# Patient Record
Sex: Male | Born: 1970 | Race: Black or African American | Hispanic: No | Marital: Married
Health system: Southern US, Community
[De-identification: ages and names within clinical notes are randomized; demographics above are authoritative.]

---

## 1999-10-27 ENCOUNTER — Emergency Department (HOSPITAL_COMMUNITY): Admission: EM | Admit: 1999-10-27 | Discharge: 1999-10-27 | Payer: Self-pay | Admitting: Emergency Medicine

## 2002-05-12 ENCOUNTER — Encounter: Payer: Self-pay | Admitting: Chiropractic Medicine

## 2002-05-12 ENCOUNTER — Encounter: Admission: RE | Admit: 2002-05-12 | Discharge: 2002-05-12 | Payer: Self-pay | Admitting: Chiropractic Medicine

## 2002-11-01 ENCOUNTER — Encounter: Payer: Self-pay | Admitting: Internal Medicine

## 2002-11-01 ENCOUNTER — Encounter: Payer: Self-pay | Admitting: Emergency Medicine

## 2002-11-01 ENCOUNTER — Observation Stay (HOSPITAL_COMMUNITY): Admission: EM | Admit: 2002-11-01 | Discharge: 2002-11-02 | Payer: Self-pay | Admitting: Emergency Medicine

## 2002-12-29 ENCOUNTER — Ambulatory Visit (HOSPITAL_COMMUNITY): Admission: RE | Admit: 2002-12-29 | Discharge: 2002-12-29 | Payer: Self-pay | Admitting: Gastroenterology

## 2002-12-29 ENCOUNTER — Encounter (INDEPENDENT_AMBULATORY_CARE_PROVIDER_SITE_OTHER): Payer: Self-pay | Admitting: Specialist

## 2004-05-26 ENCOUNTER — Inpatient Hospital Stay (HOSPITAL_COMMUNITY): Admission: EM | Admit: 2004-05-26 | Discharge: 2004-06-09 | Payer: Self-pay | Admitting: Emergency Medicine

## 2004-06-01 ENCOUNTER — Encounter (INDEPENDENT_AMBULATORY_CARE_PROVIDER_SITE_OTHER): Payer: Self-pay | Admitting: Specialist

## 2008-01-29 ENCOUNTER — Ambulatory Visit (HOSPITAL_BASED_OUTPATIENT_CLINIC_OR_DEPARTMENT_OTHER): Admission: RE | Admit: 2008-01-29 | Discharge: 2008-01-29 | Payer: Self-pay | Admitting: Urology

## 2008-01-29 ENCOUNTER — Encounter (INDEPENDENT_AMBULATORY_CARE_PROVIDER_SITE_OTHER): Payer: Self-pay | Admitting: Urology

## 2008-04-17 ENCOUNTER — Emergency Department (HOSPITAL_COMMUNITY): Admission: EM | Admit: 2008-04-17 | Discharge: 2008-04-17 | Payer: Self-pay | Admitting: Emergency Medicine

## 2009-12-11 ENCOUNTER — Emergency Department (HOSPITAL_COMMUNITY): Admission: EM | Admit: 2009-12-11 | Discharge: 2009-12-12 | Payer: Self-pay | Admitting: Emergency Medicine

## 2010-06-12 LAB — URINALYSIS, ROUTINE W REFLEX MICROSCOPIC
Glucose, UA: NEGATIVE mg/dL
Ketones, ur: NEGATIVE mg/dL
Nitrite: NEGATIVE
Specific Gravity, Urine: 1.022 (ref 1.005–1.030)
Urobilinogen, UA: 0.2 mg/dL (ref 0.0–1.0)
pH: 5.5 (ref 5.0–8.0)

## 2010-06-12 LAB — URINE MICROSCOPIC-ADD ON

## 2010-07-10 NOTE — Op Note (Signed)
NAME:  Pitkin, Sanay                ACCOUNT NO.:  [id number]   MEDICAL RECORD NO.:  [id number]          PATIENT TYPE:  AMB   LOCATION:  NESC                         FACILITY:  [facility]   PHYSICIAN:  Ronald L. Earlene Plater, M.D.  DATE OF BIRTH:  11/15/1970   DATE OF PROCEDURE:  01/29/2008  DATE OF DISCHARGE:                               OPERATIVE REPORT   ATTENDING:  Gaynelle Arabian, M.D.   ASSISTANT:  Dr.Bobin Patel.   PREOPERATIVE DIAGNOSIS:  Patient desiring sterilization.   POSTOPERATIVE DIAGNOSIS:  Patient desiring sterilization.   PROCEDURE:  Bilateral scalpel-less vasectomy.   INDICATIONS:  This is a 40 year old gentleman who desires surgical  sterilization.  Preoperatively, risks and benefits as well as  alternatives were discussed.  Specifically, we discussed with him the  need for continued use birth control methods until he is cleared with 2  negative semen analyses.   PROCEDURE IN DETAIL:  The patient was brought back to the operating room  and after the successful induction of LMA anesthetic he was prepped and  draped in the usual sterile fashion.  Please note a preoperative time-  out was performed and he received preprocedural antibiotics.   His left scrotum was palpated and his vas was brought to the skin. With  the left hand holding the vas to the skin, using the scalpel with  puncture device, the skin was punctured.  The vas was then grasped with  a vas grasper and cleaned of its tunic.  Once the vas was identified and  cleaned adequately it was tied proximally and distally, leaving  approximately a 2-inch portion in between that was excised.  All  bleeding was coagulated and a clamp was left on the area of vas  resection so it could be reinspected at the end of the case.   We turned our attention to the right side.  In much the same fashion,  the vas was identified by palpation through the skin and brought to the  skin.  Using an incisionless puncture device, we  punctured the skin,  placed a vas clamp around the vas and cleaned it of its tunic.  We tied  proximally and distally, leaving approximately 2 cm of vas in between  that was excised.  The ends of the vas were coagulated and all bleeding  was stopped with cautery.   At this point both sides were reinspected.  Both vasa had been cut and  sent for pathologic review.  All ends of the vas, proximal and distal  bilaterally, were coagulated.  As there was no bleeding, the procedure  was ended by placing the remaining vas back into the scrotum with  tenting of the scrotal skin.  At this point the procedure was ended,  4x4s placed over the wound.   Please note Dr. Earlene Plater was the responsible surgeon and present throughout  the entirety of the case.   ESTIMATED BLOOD LOSS:  Minimal.   URINE OUTPUT:  Unrecorded.   DRAINS:  None.   SPECIMENS:  Bilateral vasa, sent separately.   COMPLICATIONS:  None apparent.   DISPOSITION:  The patient to the PACU.      Delman Kitten, MD      Lucrezia Starch. Earlene Plater, M.D.  Electronically Signed    DW/MEDQ  D:  01/29/2008  T:  01/29/2008  Job:  [id number]

## 2010-07-13 NOTE — Discharge Summary (Signed)
NAME:  Whitaker, Brent GAIL NO.:  [id number]   MEDICAL RECORD NO.:  [id number]          PATIENT TYPE:  INP   LOCATION:  0451                         FACILITY:  [facility]   PHYSICIAN:  Theone Stanley, MD   DATE OF BIRTH:  02/02/1971   DATE OF ADMISSION:  05/26/2004  DATE OF DISCHARGE:                                 DISCHARGE SUMMARY   ADMITTING DIAGNOSES:  1.  Vomiting and abdominal pain.  2.  Allergic rhinitis.   DISCHARGE DIAGNOSES:  1.  Gallstone pancreatitis.  2.  Cholelithiasis.  3.  Status post laparoscopic cholecystectomy with intraoperative      cholangiogram.   CONSULTATIONS:  [facility], Adolph Pollack, M.D.   PROCEDURES/DIAGNOSTIC TESTS:  The patient had an abdominal ultrasound on  May 26, 2004.  Impression was cholelithiasis and equivocal findings of  cholecystitis, no evidence of biliary dilatation, pancreas and aorta not  visualized well.  A CT of the abdomen and pelvis was performed on May 28, 2004 which showed;  Impression:  Diffuse pancreatitis, question a 1.5-cm low  density lesion within the inferior aspect of the right lobe of the liver  versus streak artifact, a small amount of fluid in he cul-d-sac without  drainable abscess, mild, degenerative changes lower lumbar spine.  Intraoperative cholangiogram was performed during the operation on June 01, 2004;  Impression:  No cholangiographic evidence of cholelithiasis.  A chest  x-ray was performed on June 04, 2004 to visualize his PICC line.  Impression:  Right PICC line tip caval-atrial junction, no change basilar  atelectasis.  As noted before, he patient had a laparoscopic cholecystectomy  and intraoperative cholangiogram on June 01, 2004.   PERTINENT LABORATORY VALUES:  Last CBC was on June 07, 2004 which showed a  white count of 8.3, hemoglobin 12, hematocrit 35, platelets at 390,000.  June 09, 2004 was his last BMET which showed a sodium of 139, potassium  4.1,  chloride 110, CO2 25, glucose 193, BUN 10, creatinine 0.8, calcium 9.1,  amylase 294, lipase 194.  Prealbumin on June 08, 2003 was 16.6.   HOSPITAL COURSE:  Mr. Brent Whitaker is a very pleasant 40 year old African-American  gentleman with a history of cholelithiasis presenting to the emergency room  with a several-day history of intractable vomiting and epigastric pain with  radiation into the back.  He had no hematemesis, no fever, or no chills on  his presentation.  He was slightly short of breath.  He was unable to keep  any liquid down.  Because of this, he presented to the ER.  Ultrasound in  the ER showed biliary colic and cholelithiasis.  Noted on his admission, he  had elevated amylase and lipase indicating pancreatitis.   Gallstone pancreatitis.  The patient was admitted with conservative  management, n.p.o., given pain medications, anti-emetics.  His original  white count on admission was 11.6, hemoglobin of 13, hematocrit of 39,  platelets at 221,000.  On complete metabolic profile, abnormalities was a  glucose of 158, BUN of 8, creatinine of 1.  AST at 3000, ALT at 409,  lipase  at 997, amylase at 1435.  A hemoglobin A1c was also performed on May 27, 2004 which showed that it was 5.7 indicating his acute elevation of his  blood sugars were secondary to his current problem.  Because of his  presentation, surgery was consulted, and it was felt that once the patient's  situation had defervesced that he will need to be taken to surgery.  Over  the next ensuing days, the patient continued to have pains.  His laboratory  values defervesced.  He was afebrile.  However, because of his n.p.o. status  for an extensive period of time, it was decided to place a PICC and start  him on TPN.  The patient continued to do well, and it was felt that he could  be taken to surgery on June 01, 2004.  The patient underwent a laparoscopic  cholecystectomy with intraoperative cholangiogram.  This was  performed  without any complications.  Postoperatively, the patient did well;  however,  the issue was that he continued to have elevated amylase and lipase,  although clinically he was very stable.  He continued to be n.p.o., but as  his amylase and lipase stated to trend down and he was clinically stable, it  was felt that we could try him on a clear liquid diet.  The patient was  started on a clear liquid diet on June 08, 2004.  He tolerated this well.  He had no abdominal pain.  Because of this, it was decided that he could be  discharged on a clear liquid diet and increase his p.o. intake and advance  his diet slowly.  The patient understood that if he started to have  increasing abdominal pain to contact his primary care physician or come to  the ER and to continue with his clear liquid diet for the next couple of  days and advance slowly.   DISCHARGE MEDICATIONS:  1.  Claritin at home dose.  2.  Protonix 40 mg 1 p.o. daily for an additional two weeks.  3.  Lortab 5/500 1-2 tablets p.o. q.6-8 h. p.r.n.   DISCHARGE INSTRUCTIONS:  The patient was instructed to increase his activity  slowly.  He is to return to work on June 18, 2004.  To advance his diet  slowly, continue with a clear liquid diet for the next couple of days and  the slowly add different foods.   FOLLOW UP:  The patient is to follow up with Dr. Abbey Chatters in 1-2 weeks.  He is supposed to get laboratory tests, a CBC, amylase, and lipase at that  time, and Dr. Clovis Riley in 2-3 weeks.      AEJ/MEDQ  D:  06/09/2004  T:  06/09/2004  Job:  [id number]   cc:   Adolph Pollack, M.D.  [address]  [place]  [place] [address]

## 2010-07-13 NOTE — Op Note (Signed)
NAME:  Brent Whitaker, Brent Whitaker                          ACCOUNT NO.:  [id number]   MEDICAL RECORD NO.:  [id number]                   PATIENT TYPE:  AMB   LOCATION:  ENDO                                 FACILITY:  [facility]   PHYSICIAN:  Danise Edge, M.D.                DATE OF BIRTH:  07-07-1970   DATE OF PROCEDURE:  12/29/2002  DATE OF DISCHARGE:                                 OPERATIVE REPORT   PROCEDURES:  Esophagogastroduodenoscopy, small bowel biopsy, and  colonoscopy.   PROCEDURE INDICATION:  Mr. Jun Osment. Totten is a 40 year old male born 07/21/1970.  Mr. Edmonston was hospitalized following a bout of biliary colic  associated with gallstones by ultrasound.  On admission his hemoglobin was  slightly low at 10.7 g.  His serum ferritin was measured at approximately 5  ng/mL and his serum iron saturation was low at 13%.   Mr. Apollo is scheduled to undergo esophagogastroduodenoscopy with small bowel  biopsies to look for pathologic signs of the presence of celiac disease and  also undergo a screening colonoscopy.   ENDOSCOPIST:  Danise Edge, M.D.   PREMEDICATION:  Versed 7.5 mg, Demerol 50 mg.   PROCEDURE:  Esophagogastroduodenoscopy with small bowel biopsies.  After  obtaining informed consent, Mr. Gillooly was placed in the left lateral  decubitus position.  I administered intravenous Demerol and intravenous  Versed to achieve conscious sedation for the procedure.  The patient's blood  pressure, oxygen saturation, and cardiac rhythm were monitored throughout  the procedure and documented in the medical record.   The Olympus gastroscope was passed through the posterior hypopharynx into  the proximal esophagus without difficulty.  The hypopharynx, larynx, and  vocal cords appeared normal.   Esophagoscopy:  The proximal, mid-, and lower segments of the esophageal  mucosa appear normal.   Gastroscopy:  Mr. Brannen has a small hiatal hernia.  Retroflexed view of the  gastric cardia and  fundus was normal.  The gastric body, antrum, and pylorus  appear normal.   Duodenoscopy:  The duodenal bulb, second portion of duodenum, and third  portion of duodenum appear normal endoscopically.  Four biopsies were taken  from the second portion of the duodenum and submitted for pathologic  evaluation.   ASSESSMENT:  Normal esophagogastroduodenoscopy.  Small bowel biopsies to  rule out celiac sprue pending.   PROCEDURE:  Screening proctocolonoscopy to the cecum.  Anal inspection was  normal.  Digital rectal exam was normal.  The prostate was non-nodular.  The  Olympus pediatric colonoscope was introduced into the rectum and easily  advanced to the cecum.  Colonic preparation for the exam today was  excellent.   Rectum normal.   Sigmoid colon and descending colon normal.   Splenic flexure normal.   Transverse colon normal.   Hepatic flexure normal.   Ascending colon normal.   Cecum and ileocecal valve normal.   ASSESSMENT:  Normal proctcolonoscopy to the cecum.   RECOMMENDATIONS:  1. Mr. Gilliam has known gallstones and has recovered from his first biliary     colic attack.  If he has any further biliary colic-type pain, he should     undergo a laparoscopic cholecystectomy.  2. Mr. Sparacino has unexplained iron-deficiency anemia.  Colonoscopy is normal.     Esophagogastroduodenoscopy is normal.  Small bowel biopsies are pending     to rule out celiac sprue.                                               Danise Edge, M.D.    MJ/MEDQ  D:  12/29/2002  T:  12/29/2002  Job:  [id number]   cc:   L. Lupe Carney, M.D.  [address] [place]  [place] [address]  Fax: [phone]

## 2010-07-13 NOTE — H&P (Signed)
NAME:  Brent Whitaker, Brent Whitaker                ACCOUNT NO.:  [id number]   MEDICAL RECORD NO.:  [id number]          PATIENT TYPE:  INP   LOCATION:  0451                         FACILITY:  [facility]   PHYSICIAN:  Corinna L. Lendell Caprice, MDDATE OF BIRTH:  03/16/1970   DATE OF ADMISSION:  05/26/2004  DATE OF DISCHARGE:                                HISTORY & PHYSICAL   CHIEF COMPLAINT:  Vomiting and stomachache.   HISTORY OF PRESENT ILLNESS:  Brent Whitaker is a pleasant 40 year old black male  with a history of cholelithiasis, who presents to the emergency room with a  several day history of intractable vomiting and epigastric pain with  radiation into the back.  He has had no hematemesis, no fevers or chills.  He has had some slight shortness of breath.  He drinks occasionally, but  denies drinking to excess, nor daily.  He is unable to keep down any  liquids.  He feels a bit better after receiving pain medication and anti-  emetics in the emergency room.  He has had ultrasounds in the past for  biliary colic, and was noted to have cholelithiasis.   PAST MEDICAL HISTORY:  As above.  Otherwise, seasonal allergic rhinitis.   MEDICATIONS:  Claritin.   SOCIAL HISTORY:  The patient drinks occasionally, but denies excessive  drinking.  He does not smoke or drink.  He is here with his wife.   FAMILY HISTORY:  Noncontributory.   REVIEW OF SYSTEMS:  As above.  Otherwise, negative.   PHYSICAL EXAMINATION:  VITAL SIGNS:  Temperature is 98.7, blood pressure  137/86, pulse 78, respiratory rate 20, oxygen saturation 95% on room air.  GENERAL:  The patient is well-nourished, well-developed, who appears to be  in some pain.  HEENT:  Normocephalic and atraumatic.  Pupils equal, round and reactive to  light.  Sclerae are anicteric.  He has no conjunctivitis.  He has moist  mucous membranes.  Oropharynx is without erythema or exudate.  NECK:  Supple.  No lymphadenopathy.  No thyromegaly.  No lymphadenopathy.  LUNGS:   Clear to auscultation bilaterally without wheezes, rhonchi, or  rales.  CARDIOVASCULAR:  Regular rate and rhythm without murmurs, gallops, or rubs.  ABDOMEN:  Normal bowel sounds, soft.  Minimally tender over the epigastrium  after receiving several doses of morphine and Dilaudid.  GU/RECTAL:  Deferred.  EXTREMITIES:  No clubbing, cyanosis, or edema.  NEUROLOGIC:  Alert and oriented.  Cranial nerves and sensory and motor exam  are grossly intact.  PSYCHIATRIC:  Normal affect.  SKIN:  No rash.   LABORATORY DATA:  White blood cell count is 11.6 with 92% neutrophils.  The  rest of his CBC is unremarkable.  Complete metabolic panel significant for a  glucose of 158, SGOT of 300, SGPT of 409.  His alkaline phosphatase is  normal at 117.  Amylase is 1435.  Lipase is 997.  UA shows 30 protein;  otherwise, unremarkable.  Ultrasound of the right upper quadrant shows  cholelithiasis with many stones at the neck of the gallbladder.  No ductal  dilatation or common duct stone visualized.  ASSESSMENT AND PLAN:  1.  Gallstone pancreatitis.  The patient will be admitted to the floor,      receive IV fluids.  He will be NPO.  Get pain medications, anti-emetics,      proton pump inhibitor.  He will eventually need a surgical consult for a      cholecystectomy after his pancreatitis improves.  I will follow serial      abdominal exams and pancreatic enzymes.  2.  Increased liver function tests secondary to above.  3.  Hyperglycemia - no history of diabetes.  This is most likely stress      response, and I will check a hemoglobin A1C.      CLS/MEDQ  D:  05/26/2004  T:  05/26/2004  Job:  [id number]   cc:   L. Lupe Carney, M.D.  [address] [place]  [place] [address]  Fax: [phone]   Danise Edge, M.D.  [address]  [place]  [place] [address]  Fax: [phone]

## 2010-07-13 NOTE — Discharge Summary (Signed)
NAME:  Brent Whitaker, Brent Whitaker                          ACCOUNT NO.:  [id number]   MEDICAL RECORD NO.:  [id number]                   PATIENT TYPE:  INP   LOCATION:  0450                                 FACILITY:  [facility]   PHYSICIAN:  Jackie Plum, M.D.             DATE OF BIRTH:  October 09, 1970   DATE OF ADMISSION:  11/01/2002  DATE OF DISCHARGE:  11/02/2002                                 DISCHARGE SUMMARY   PRIMARY CARE PHYSICIAN:  L. Lupe Carney, M.D.   GASTROENTEROLOGIST:  Danise Edge, M.D.   DISCHARGE DIAGNOSES:  1. Cholelithiasis.  2. Abdominal pain acute intermittent, resolved.  3. Iron deficiency anemia etiology unclear on iron therapy now.   DISCHARGE MEDICATIONS:  1. Protonix 40 mg q.d.  2. Niferex 150 mg q.d.  3. Colace 100 to 200 mg q.d. p.r.n.   ALLERGIES:  NKDA.   PROCEDURE:  None.   HISTORY OF PRESENT ILLNESS:  The patient is a 40 year old, black male who  presents with a sudden onset of epigastric abdominal pain and recurrent  vomiting times three.  There was no associated hematemesis, melena, or  hematochezia.  No fevers, shortness of breath, or dysuria.  He was treated  with Demerol and Phenergan in the emergency department, which helped his  pain, but made him very sedated.  He has had several prior episodes of this.  About a year and a half ago, he was seen at [facility] Room with  an episode of epigastric pain, but left after the pain had resolved.  He  denies taking any current medications.  He states he did not consume any  alcohol or illicit drugs on day of presentation.  He is admitted for further  evaluation.   HOSPITAL COURSE:  Patient is admitted to a regular bed, provided with IV  hydration, initially kept n.p.o. and then his diet was gradually increased  from clear liquids to a full regular diet, which he tolerated without  difficulty.  Patient's pain and nausea, as well as his vomiting subsided  after his initial dose of Demerol and  Phenergan.  On day of discharge, he is  pain [place].  Patient was started on Protonix IV initially and changed to p.o.  prior to discharge; he tolerated this well.  Patient was evaluated by Dr.  Danise Edge of [facility] for findings consistent with cholelithiasis on  abdominal CT.  There were no other significant findings on abdominal CT.  Dr. Laural Benes felt that the patient was appropriate for EGD and colonoscopy as  an outpatient.  We recommended discharge on Protonix and Niferex as noted.  KUB of the abdomen revealed moderate constipation, which was relieved with  Mag-Citrate.  Patient's bowel sounds are present in all 4 quadrants.  There  is abdominal tenderness, masses, or organomegaly noted at this time.   The patient was found to have normocytic normochromic anemia with a  hemoglobin of  10.4, hematocrit of 31.3 and an MCV of 81.5.  Iron studies  revealed iron of 45 mg/dL.  A TIBC of 355% saturation of 13 and blood  ferritin of 5.  Patient is therefore started on Niferex therapy to correct  his anemia.  Source of this anemia is unknown at this time; however, he is  asymptomatic.   Patient, at time of discharge, if Mosher of abdominal pain, nausea, or  vomiting.  Vital signs are stable.  He is afebrile.  He is able to tolerate  regular food.   DISCHARGE LABS:  Other than those as noted in the narrative:  Total  cholesterol 161, triglycerides 99, HDL 30, LDL 111, sodium 138, potassium  3.3, glucose 90, BUN 5, creatinine 1.1.  UA is negative for ketones,  infection, or glucose.  Lipase 35, amylase 147.   CONSULTS:  Danise Edge, M.D. of [facility].   CONDITION AT DISCHARGE:  Good.   DISPOSITION:  Discharged to home.   FOLLOW UP:  Patient is instructed to followup with Dr. Danise Edge for  further evaluation of his abdominal pain and cholelithiasis.  He is provided  with his phone number by Dr. Laural Benes.  He is instructed to followup with his  primary MD, Dr. Clovis Riley  p.r.n.       Ellender Hose. Christian Mate, M.D.    SMD/MEDQ  D:  11/02/2002  T:  11/02/2002  Job:  [id number]   cc:   L. Lupe Carney, M.D.  [address] [place]  [place] [address]  Fax: [phone]   Danise Edge, M.D.  [address]  [place]  [place] [address]  Fax: [phone]

## 2010-07-13 NOTE — Op Note (Signed)
NAME:  Brent Whitaker, Brent Whitaker                ACCOUNT NO.:  [id number]   MEDICAL RECORD NO.:  [id number]          PATIENT TYPE:  INP   LOCATION:  0451                         FACILITY:  [facility]   PHYSICIAN:  Adolph Pollack, M.D.DATE OF BIRTH:  02/08/1971   DATE OF PROCEDURE:  06/01/2004  DATE OF DISCHARGE:                                 OPERATIVE REPORT   PREOPERATIVE DIAGNOSIS:  Biliary pancreatitis.   POSTOPERATIVE DIAGNOSIS:  Biliary pancreatitis.   PROCEDURE:  Laparoscopic cholecystectomy with intraoperative cholangiogram.   SURGEON:  Adolph Pollack, M.D.   ASSISTANT:  Gita Kudo, M.D.   ANESTHESIA:  General.   INDICATIONS:  Mr. Dolinger is a 40 year old male admitted to the medical service  on May 26, 2004.  He was diagnosed with biliary pancreatitis.  He has  slowly improved and clinically is asymptomatic.  His pancreatic enzymes have  come down.  Liver function tests are minimally elevated (SGPT).  He now  presents for laparoscopic cholecystectomy.  The procedure and risks,  including but not limited to bleeding, infection, common bile duct injury,  hepatic injury, bile leak, intestinal injury, exacerbation of pancreatitis,  and cardiopulmonary complications of anesthesia were explained in preop.   TECHNIQUE:  He was seen in the holding area, brought to the operating room,  placed supine on the operating room table.  General anesthetic was  administered.  His Foley catheter was placed in the bladder.  The abdominal  wall was sterilely prepped and draped.  Dilute Marcaine solution was  infiltrated in the subumbilical  region, and a small subumbilical incision  was made through the skin and subcutaneous tissue, fascia, and peritoneum  under direct vision.  The peritoneal cavity was entered, and a purse-string  suture of 0 Vicryl was placed around the fascial edges.  A Hasson trocar is  introduced into the peritoneal cavity, and a pneumoperitoneum was created by  insufflation of CO2 gas.   Next, a laparoscope was introduced.  He was placed in the reverse  Trendelenburg position.  A 10 mm trocar is placed through an epigastric  incision, and two 5 mm trocars are placed to the right mid abdomen.  I used  a 30 to rescope.   The fundus of the gallbladder was grasped, and it was intrahepatic.  There  is no acute gallbladder inflammatory change.  The infundibulum was grasped,  and it was mobilized using blunt dissection.  I identified the cystic duct  and created a window around it.  A clip is placed at the cystic  duct/gallbladder junction.  A small incision was made just below the cystic  duct/gallbladder junction.  A cholangiocatheter was placed through the  abdominal wall into the cystic duct, and a cholangiogram was performed.  Under real-time fluoroscopy, dilute contrast material was injected into a  long cystic duct.  The common hepatic, right and left hepatic, and common  bile ducts were visualized.  Contrast splashed into the duodenum rapidly  without obvious evidence of obstruction.  The final report is pending the  radiologist's interpretation.   The cholangiocatheter was removed.  The cystic  duct was then clipped three  times proximally and divided.  The cystic artery was identified, clipped,  and divided.  The gallbladder was dissected Haldeman from the liver bed intact.  What appeared to be a potential excess reductive Luschka was clipped.  Once  the gallbladder was dissected Septer from the liver bed, it was placed in the  Endopouch bag.  The gallbladder fossa was irrigated, and bleeding points  controlled with the cautery.  The area was inspected, and no further  bleeding was noted.  No bile leak was noted.  The gallbladder was then  removed through the subumbilical port in the Endopouch bag.  The  subumbilical fascial defect was closed under laparoscopic vision by  tightening up and tying down the purse-string suture.  The perihepatic  area  was irrigated, and fluid was returned clear.  The remaining trocars were  removed, and a pneumoperitoneum was released.  The skin incisions were  closed with 4-0 Monocryl subcuticular stitches followed by Steri-Strips and  sterile dressings.  He tolerated the procedure well without any apparent  complications and was taken to the recovery room in satisfactory condition.      TJR/MEDQ  D:  06/01/2004  T:  06/01/2004  Job:  [id number]

## 2010-07-13 NOTE — H&P (Signed)
NAME:  Brent Whitaker, Brent Whitaker NO.:  [id number]   MEDICAL RECORD NO.:  [id number]                   PATIENT TYPE:  EMS   LOCATION:  ED                                   FACILITY:  [facility]   PHYSICIAN:  Sherin Quarry, MD                   DATE OF BIRTH:  Oct 01, 1970   DATE OF ADMISSION:  11/01/2002  DATE OF DISCHARGE:                                HISTORY & PHYSICAL   HISTORY OF PRESENT ILLNESS:  This 40 year old man is currently very sedated  after receiving an injection of Demerol and Phenergan, which makes it very  difficult to obtain any history.  Most of the history is obtained from a  friend who accompanied him.  Apparently, on Sunday the patient ate at  multiple fast-food restaurants including [employer], [employer], and possibly  some other fast-food restaurant, and then went to a [place] steakhouse  where he ate chicken and steak.  He came home about 10 p.m. and went to  sleep but was awakened at 1:30 by severe epigastric pain and recurrent  vomiting x3.  There was no associated hematemesis, melena, or hematochezia.  No fever, shortness of breath, or dysuria.  He presented to the [facility]  Emergency Room where he was given Demerol 25 and Phenergan 12.5 which has,  as mentioned above, sedated him rather heavily.  According to the patient  and his friend, he has had several similar episodes.  About 1-1/2 years ago  go he went to the [facility] Emergency Room with an episode of epigastric  pain but left after about an hour when the pain resolved.  He states that he  is not currently taking any medications.  He states he did not consume any  alcohol, has not used any drugs today.   PAST MEDICAL HISTORY:   ALLERGIES:  None.   MEDICATIONS:  None.   ILLNESSES:  None.   OPERATIONS:  The patient states he has had multiple dental extractions.   FAMILY HISTORY:  The patient's mother died when he was 3.  He is not sure  exactly why.  His father had  trouble with blood clotting, and he is not  sure whether he has any other health problems.  He has four siblings who are  alive and well.   SOCIAL HISTORY:  The patient works at [employer] and also at [employer].  He does not smoke.  He will occasionally have one or two alcoholic beverages  in a day.  He denies drug use.   REVIEW OF SYSTEMS:  HEAD:  He denies headache or dizziness.  EYES:  He  denies visual blurring or diplopia.  EARS, NOSE, THROAT:  Denies earache,  sinus pain, or sore throat.  CHEST:  Denies coughing, wheezing, or chest  congestion.  CARDIOVASCULAR:  Denies orthopnea, PND, or ankle edema.  GASTROINTESTINAL:  See above.  GENITOURINARY:  Denies dysuria, urinary  frequency, hesitancy, or nocturia.  RHEUMATOLOGIC:  Denies back pain or  joint pain.  HEMATOLOGIC:  Denies easy bleeding or bruising.  NEUROLOGIC:  Denies history of seizure or stroke.   PHYSICAL EXAMINATION:  VITAL SIGNS:  Blood pressure is 149/85, pulse is 72,  respirations 16.  HEENT:  Within normal limits.  CHEST:  Clear to auscultation and percussion.  BACK:  No CVA or point tenderness.  CARDIOVASCULAR:  Normal S1, S2.  There are no rubs, murmurs, or gallops.  ABDOMEN:  There are positive bowel sounds which appear to be normal.  I  cannot detect any guarding or rebound tenderness.  Abdominal tenderness is  difficult to assess because the patient is sedated.  With vigorous palpation  in the epigastric area I was not able to elicit any tenderness.  NEUROLOGIC:  Normal.  EXTREMITIES:  Normal.   LABORATORY STUDIES:  Urinalysis was normal.  White count was 11,700.  Liver  profile was normal.  Renal function was normal.  Amylase was 147, lipase was  35.   IMPRESSION:  1. Recurrent epigastric pain associated with vomiting on this occasion.     Rule out gastritis, rule out peptic ulcer disease, rule out     cholecystitis, rule out mild pancreatitis.  2. Status post multiple dental extractions.   PLAN:   Will keep the patient n.p.o. for now and allow clear liquids when he  is alert.  Given intravenous fluids.  Will give him empiric IV Protonix,  p.r.n. pain medications.  The patient will be scheduled for abdominal  ultrasound to evaluate the gallbladder and pancreas.  Contingency if the  patient's symptoms  recur will be GI consult for possible endoscopy.                                               Sherin Quarry, MD    SY/MEDQ  D:  11/01/2002  T:  11/01/2002  Job:  [id number]   cc:   L. Lupe Carney, M.D.  [address] [place]  [place] [address]  Fax: [phone]

## 2010-07-13 NOTE — Consult Note (Signed)
   NAME:  Nicklas, Brent Whitaker NO.:  [id number]   MEDICAL RECORD NO.:  [id number]                   PATIENT TYPE:  INP   LOCATION:  0450                                 FACILITY:  [facility]   PHYSICIAN:  Danise Edge, M.D.                DATE OF BIRTH:  04/30/1970   DATE OF CONSULTATION:  11/02/2002  DATE OF DISCHARGE:  11/02/2002                                   CONSULTATION   HISTORY:  Mr. Conlee Sliter is a 40 year old male, born [birth year].  Mr.  Panico was admitted through the [facility] Emergency Room, November 01, 2002, to evaluated acute epigastric pain, nausea, and vomiting without  gastrointestinal bleeding.   His hospital evaluation revealed the following:  Acute abdominal x-ray  series revealed bibasilar atelectasis; abdominal ultrasound revealed  gallstones; admission white blood cell count 11,700 and follow-up white  blood cell count 6400; hemoglobin 11.5 g on admission dropping to 10.4 g  over 24 hours; MCV and platelet count normal.  Serum ferritin low at 5 ng/mL  and iron saturation low at 13%.  Complete metabolic profile was normal  except for an HDL cholesterol 30.  Amylase and lipase were normal.  Urinalysis was normal.   Mr. Mozer feels fine today.  He has consumed a regular diet.  He reports no  gastrointestinal bleeding.  His abdominal pain, vomiting have resolved.   HEENT:  Sclerae and conjunctivae normal.  LUNGS:  Clear to auscultation.  CARDIAC:  Regular rhythm without murmurs.  ABDOMEN:  Soft, flat, and nontender.  SKIN:  Warm and dry.   ASSESSMENT:  1. Gallstones by ultrasound.  I suspect Mr. Gauss intense upper abdominal     pain with vomiting was secondary to an attack of biliary colic which has     resolved.  2. Iron deficiency anemia; differential diagnosis would include chronic     blood loss, decreased iron absorption, hemolysis, hyperthyroidism.   RECOMMENDATIONS:  Mr. Popowski can be discharged from the  hospital today.  He  will see me in the office to schedule a diagnostic  esophagogastroduodenoscopy with possible small bowel biopsy and colonoscopy.                                               Danise Edge, M.D.    MJ/MEDQ  D:  11/02/2002  T:  11/02/2002  Job:  [id number]   cc:   Sherin Quarry, MD

## 2010-07-13 NOTE — Consult Note (Signed)
NAME:  Brent Whitaker, Brent Whitaker                ACCOUNT NO.:  [id number]   MEDICAL RECORD NO.:  [id number]          PATIENT TYPE:  INP   LOCATION:  0451                         FACILITY:  [facility]   PHYSICIAN:  Angelia Mould. Derrell Lolling, M.D.DATE OF BIRTH:  1970/08/03   DATE OF CONSULTATION:  05/28/2004  DATE OF DISCHARGE:                                   CONSULTATION   REASON FOR CONSULTATION:  Evaluate gallstone pancreatitis.   HISTORY OF PRESENT ILLNESS:  This is a 40 year old black man who states that  for the last 2 or 3 years, he has had some intermittent episodes of upper  abdominal pain, nausea and vomiting with pain radiating to his back. He  states he went to an emergency room once, had an ultrasound and was told he  had gallstones but that nothing needed to be done. He has basically had  minor episodes of pain over the past two years which he would take care of  at home.   He came to the [facility] Emergency Room on May 26, 2004 complaining of a  several day history of intractable vomiting, epigastric pain radiating to  the back. He denied vomiting any blood, denied fever or chills. He could not  keep down water or liquids. He was admitted by Corinna L. Lendell Caprice, MD for  management of pancreatitis.   PAST MEDICAL HISTORY:  He has been healthy, he has allergic rhinitis. He has  had some dental work otherwise negative.   CURRENT MEDICATIONS:  Claritin.   ALLERGIES:  No drug allergies but he is allergic to LATEX.   SOCIAL HISTORY:  He drinks occasionally but denies excessive drinking. He  does not smoke. He is married. He has two boys. He works for a [employer].   FAMILY HISTORY:  There is no familial disease and this is noncontributory.   REVIEW OF SYMPTOMS:  All systems are reviewed, they are noncontributory  except as described above.   PHYSICAL EXAMINATION:  GENERAL:  A pleasant young black man who is  overweight in mild distress from continued abdominal pain. He is  appropriate  and alert.  VITAL SIGNS:  Temperature 98.4, respiratory rate 20, heart rate 73, blood  pressure 139/79.  HEENT:  Eyes, sclera clear, extraocular movements intact. Ears, nose, mouth  and throat, nose, lips, tongue and oropharynx are without gross lesions.  NECK:  Supple, nontender, no mass, no adenopathy, no jugular venous  distention.  LUNGS:  Clear to auscultation, no wheezes, no rhonchi.  HEART:  Regular rate and rhythm, no murmurs or ectopy.  ABDOMEN:  Slightly obese, soft. He is tender in the right upper quadrant and  epigastrium and to a lesser degree the left upper quadrant but there is  really no peritoneal signs. No mass. Liver and spleen is not enlarged.  EXTREMITIES:  Moves all four extremities well without pain or deformity.  NEUROLOGIC:  No gross motor sensory deficits.   LABORATORY DATA:  During his 48 hour hospital course, his amylase has gone  from 1435 to 484 and then down to 208 today. His total bilirubin  has always  been normal. Alkaline phosphatase is normal. SGOT and SGPT are elevated and  have remained so.   A gallbladder ultrasound shows gallstones.  There is no wall thickening or  biliary ductal dilatation.   ASSESSMENT:  Biliary pancreatitis. This has been going on for several days  and does seem to be resolving although slowly.   PLAN:  We will hold off on cholecystectomy at this point until I get some  more objection information on how severe his pancreatitis is. We will obtain  a CT scan today to see how much pancreatic inflammation there is. I will  decide regarding timing of cholecystectomy thereafter.   In general, he should go ahead with the cholecystectomy this admission.      HMI/MEDQ  D:  05/28/2004  T:  05/28/2004  Job:  [id number]   cc:   Corinna L. Lendell Caprice, MD   L. Lupe Carney, M.D.  [address] [place]  [place] [address]  Fax: [phone]   Danise Edge, M.D.  [address]  [place]  [place] [address]   Fax: [phone]

## 2011-02-20 IMAGING — CT CT ABDOMEN W/O CM
2 of 4 series · 17 of 46 positions shown, 19 images · non-contrast
Comparison: 05/28/2004

CT ABDOMEN

CLINICAL DATA: Left flank abdominal pelvic pain.

CT ABDOMEN AND PELVIS WITHOUT CONTRAST
TECHNIQUE: Multidetector CT imaging of the abdomen and pelvis was
performed following the standard protocol without intravenous
contrast.

[Series 2: stone_wo 5.0 b40f st · axial · 0.76mm/px · z∈[-500,-30]mm · 14 of 102 slices shown, 16 images]
[im 4/102  soft-tissue]
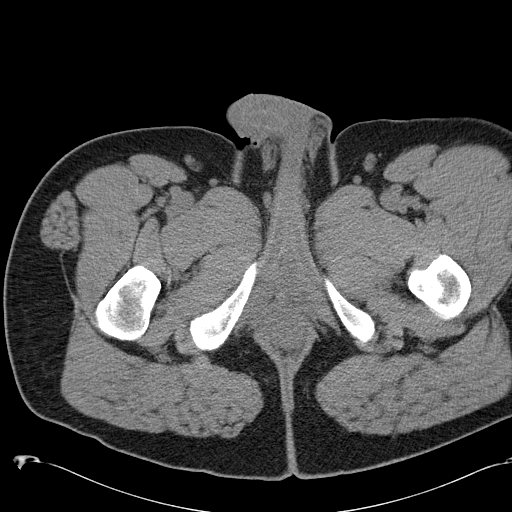
[im 4/102  bone]
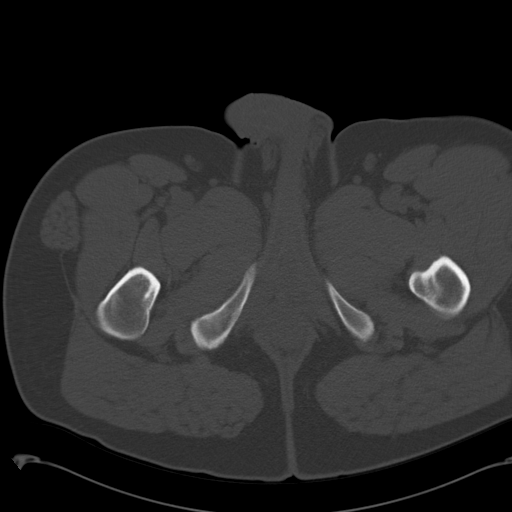
[im 12/102  soft-tissue]
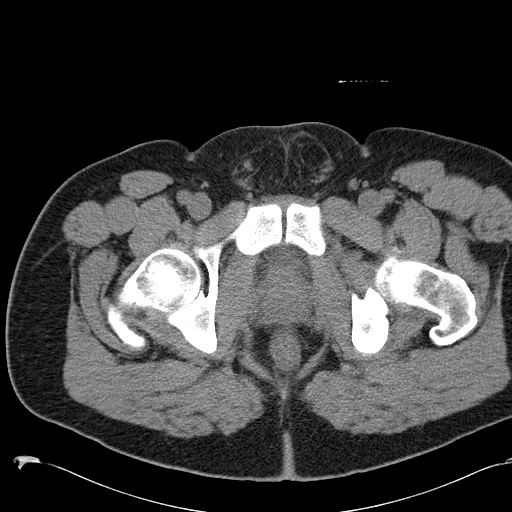
[im 20/102  soft-tissue]
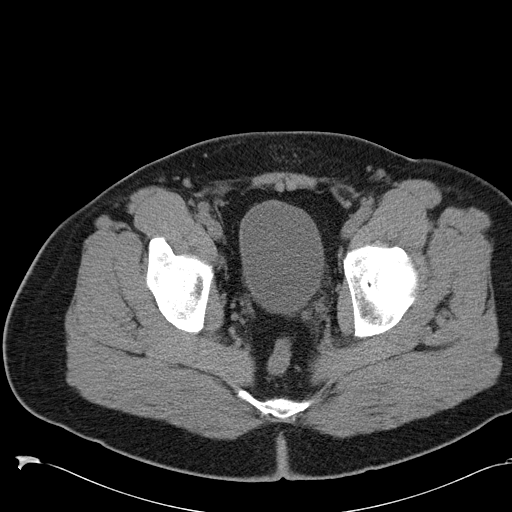
[im 28/102  soft-tissue]
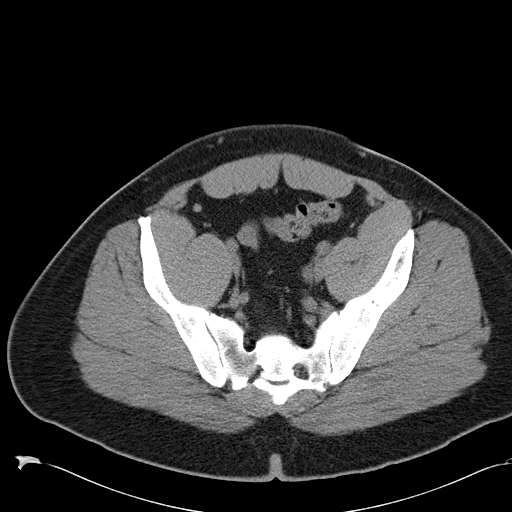
[im 35/102  soft-tissue]
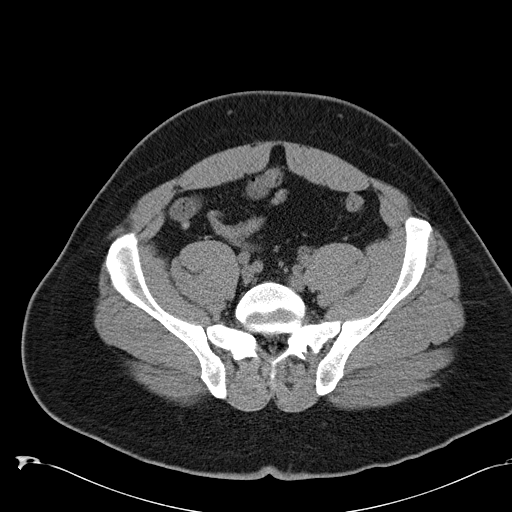
[im 39/102  soft-tissue]
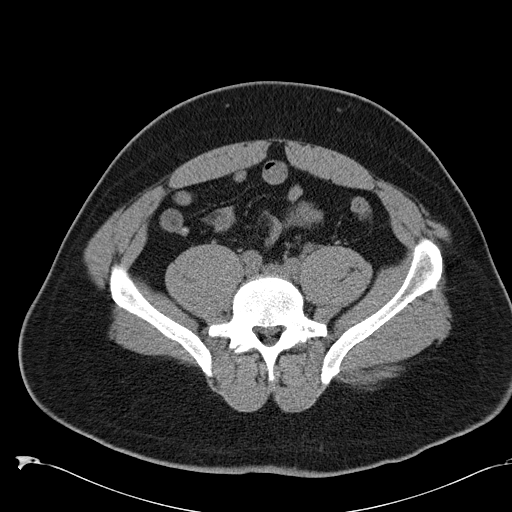
[im 47/102  soft-tissue]
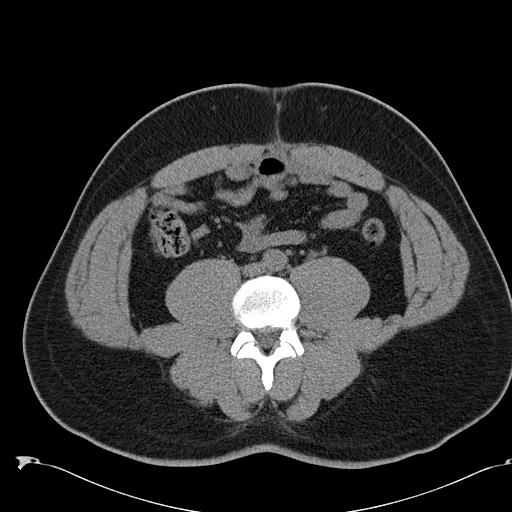
[im 55/102  soft-tissue]
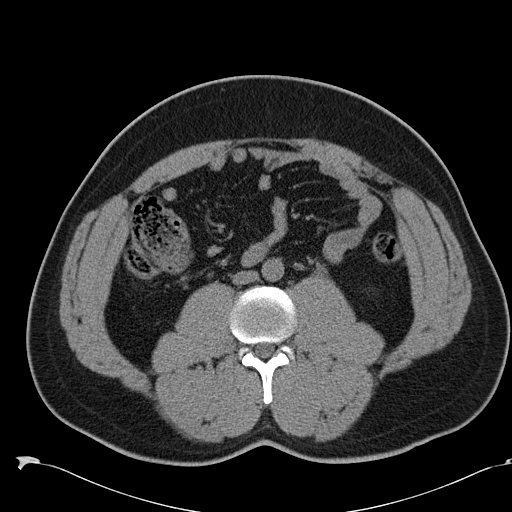
[im 63/102  soft-tissue]
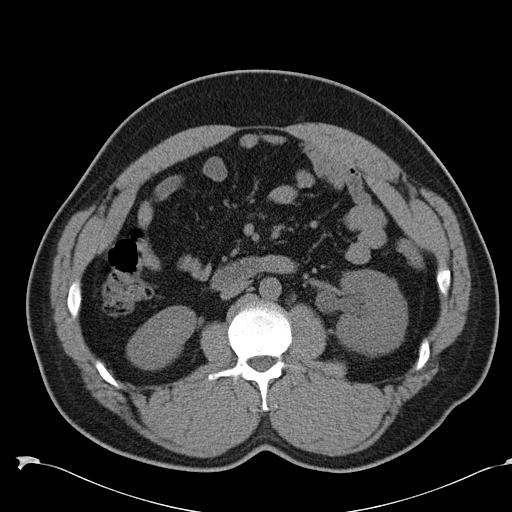
[im 63/102  bone]
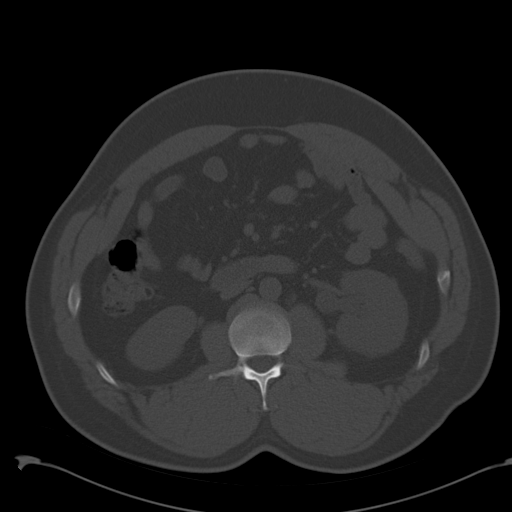
[im 67/102  soft-tissue]
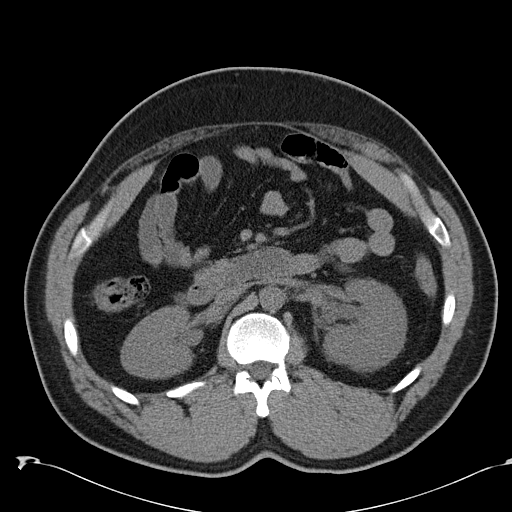
[im 74/102  soft-tissue]
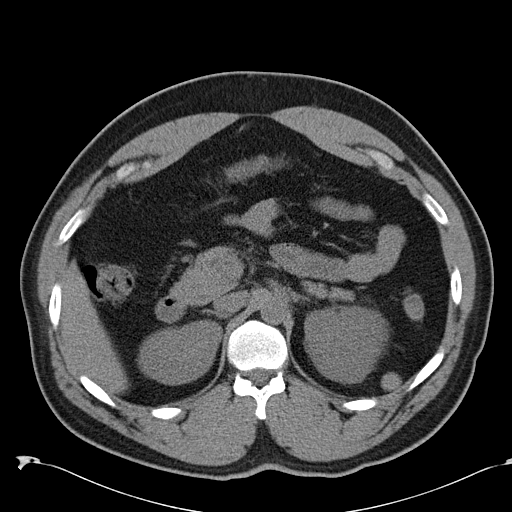
[im 82/102  soft-tissue]
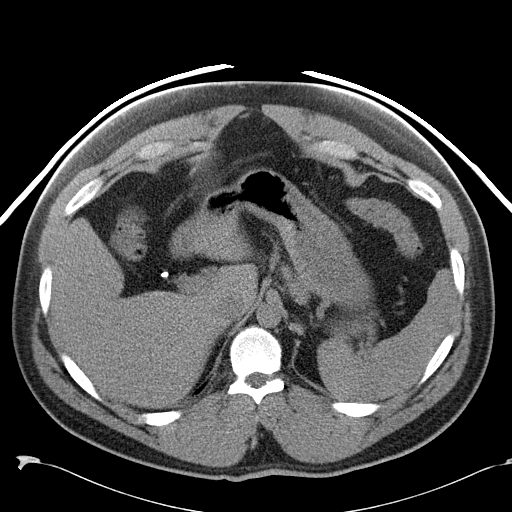
[im 90/102  soft-tissue]
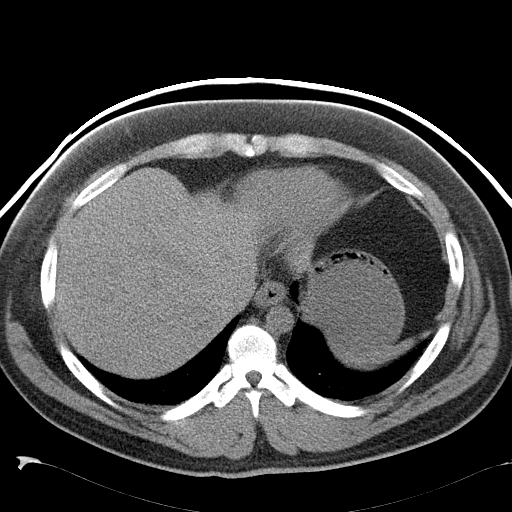
[im 98/102  soft-tissue]
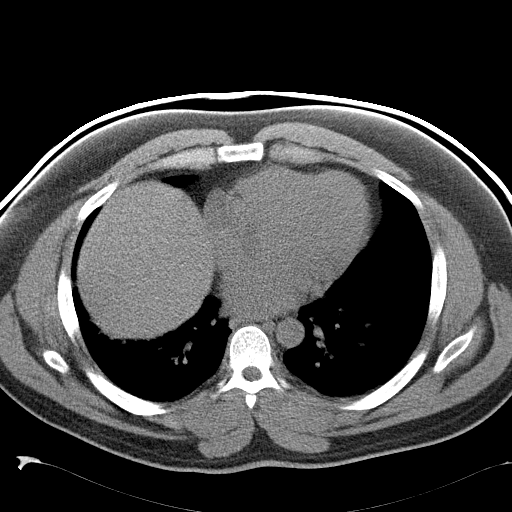

[Series 602: coronal abdomen · coronal · 1.03mm/px · 3 of 136 slices shown]
[im 46/136  soft-tissue]
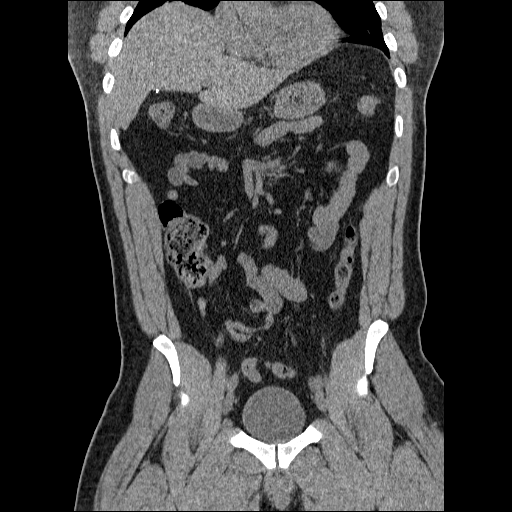
[im 61/136  soft-tissue]
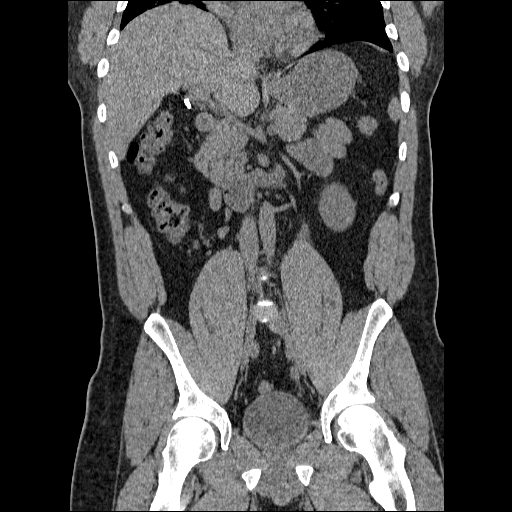
[im 76/136  soft-tissue]
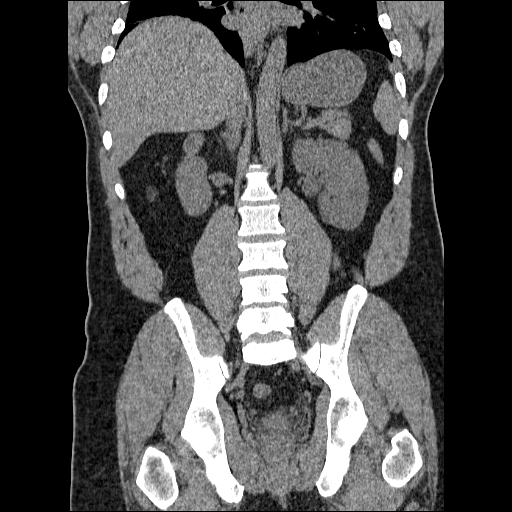

[17 of 46 positions shown; findings below may reference images not displayed]

FINDINGS: Minimal basilar atelectasis.  No pericardial or pleural
effusion.  Normal heart size.  No hiatal hernia.

The patient is status post cholecystectomy.

Left kidney demonstrates peri nephric inflammation and mild
hydronephrosis/hydroureter.  This is secondary to a left UVJ
calculus described below.  Right kidney demonstrates no acute
finding.  No additional upper urinary tract calculi.

Within the limits of noncontrast imaging, there is no additional
acute finding in the abdomen.
IMPRESSION: Acute mild obstructive left hydronephrosis and hydroureter
secondary to a UVJ calculus.

CT PELVIS
FINDINGS: Left ureter remains dilated into the pelvis with
periureteral inflammation.  At the left UVJ, there is an
obstructing calculus on image 86 measuring 4 mm in maximal
diameter.  Right ureter is normal.

No additional acute intrapelvic finding.
IMPRESSION: 4 mm mildly obstructing left UVJ calculus.

## 2012-12-29 ENCOUNTER — Other Ambulatory Visit: Payer: Self-pay | Admitting: Family Medicine

## 2012-12-31 ENCOUNTER — Ambulatory Visit
Admission: RE | Admit: 2012-12-31 | Discharge: 2012-12-31 | Disposition: A | Discharge status: Home / Self Care | Payer: BC Managed Care – PPO | Source: Ambulatory Visit | Attending: Family Medicine | Admitting: Family Medicine

## 2015-11-05 IMAGING — US US SCROTUM
1 series · 14 of 25 positions shown · non-contrast
Comparison: None.

CLINICAL DATA: Left testicular mass

EXAM:
ULTRASOUND OF SCROTUM
TECHNIQUE: Complete ultrasound examination of the testicles, epididymis, and
other scrotal structures was performed.

[Series 1: us scrotum · 0.08mm/px · 14 of 44 slices shown]
[im 1/44]
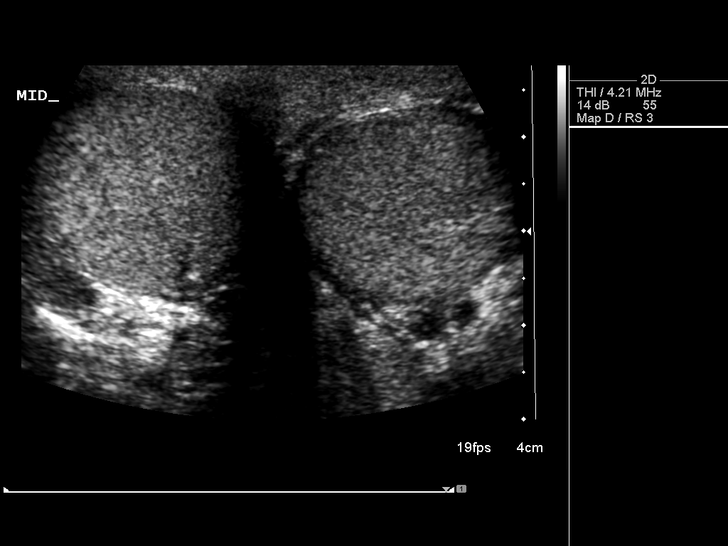
[im 4/44]
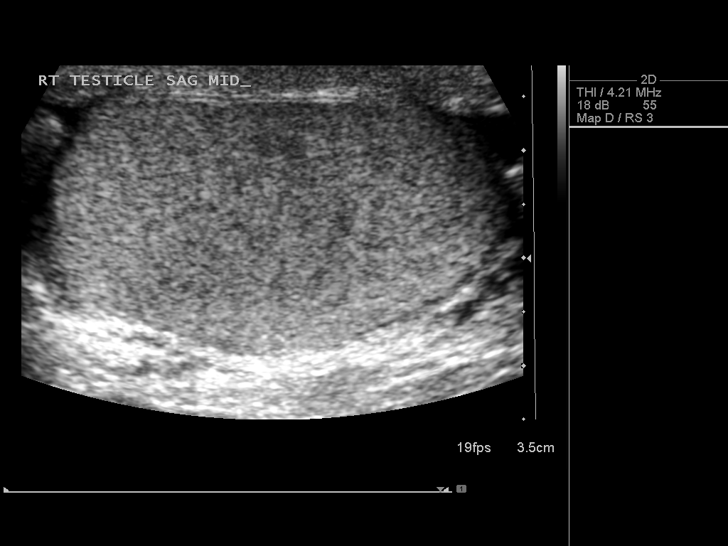
[im 8/44]
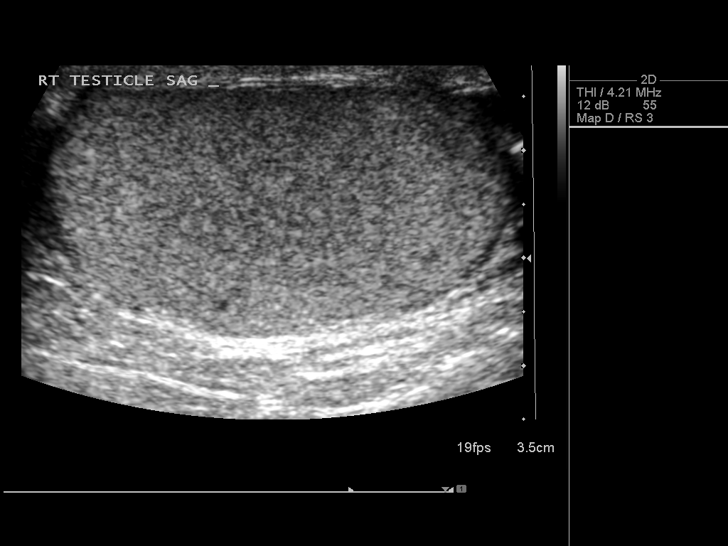
[im 11/44]
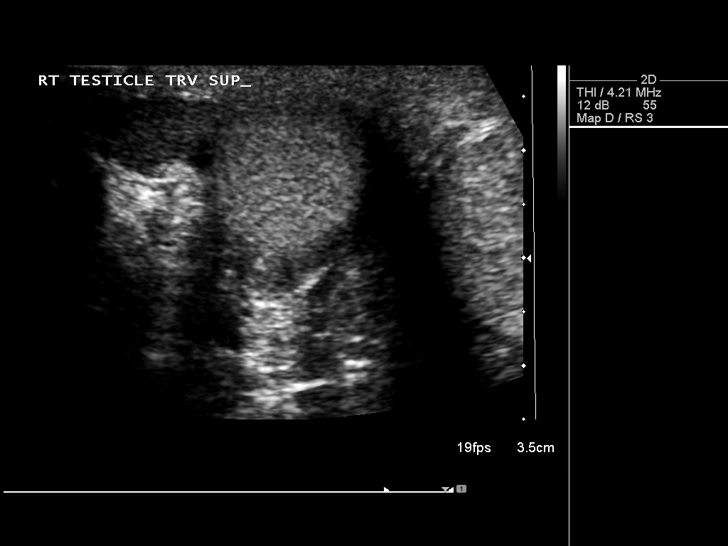
[im 15/44]
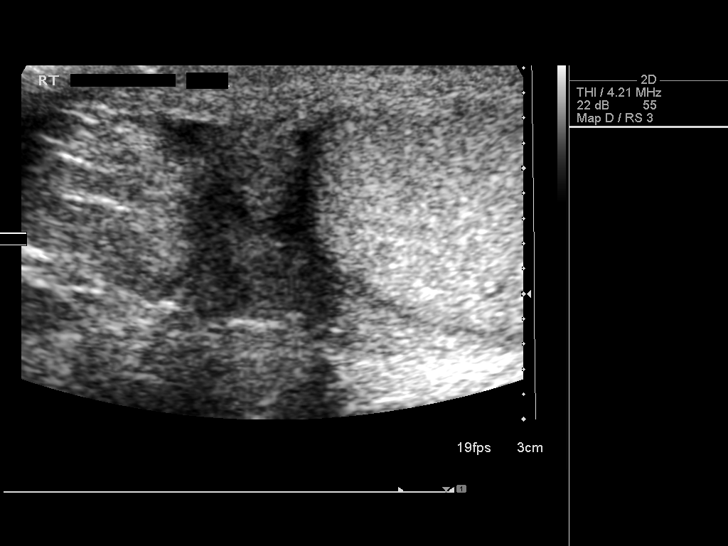
[im 17/44]
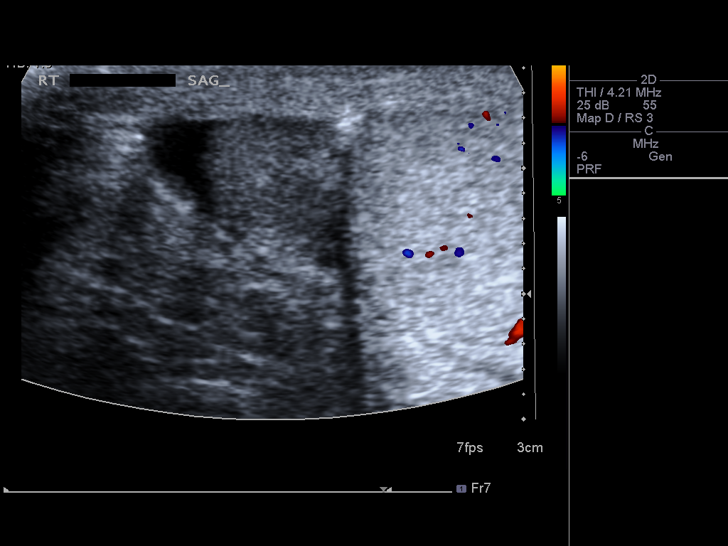
[im 20/44]
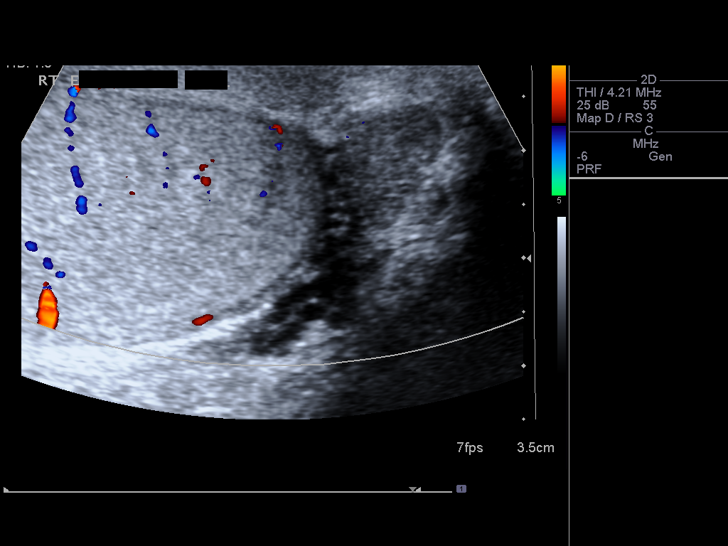
[im 24/44]
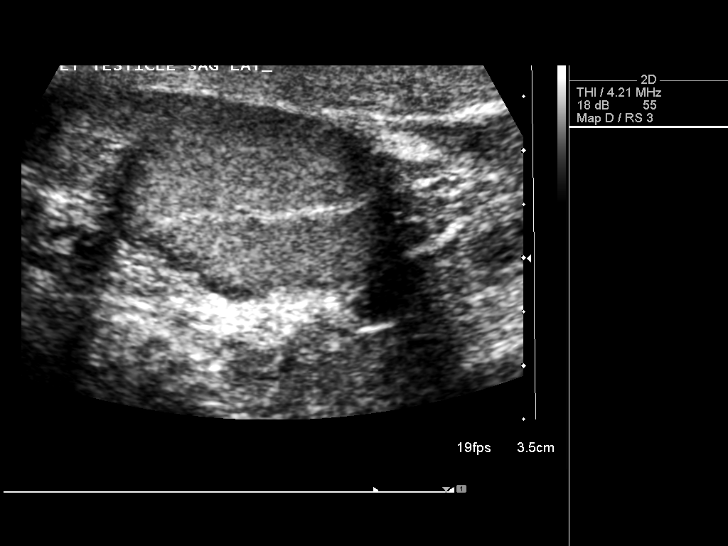
[im 27/44]
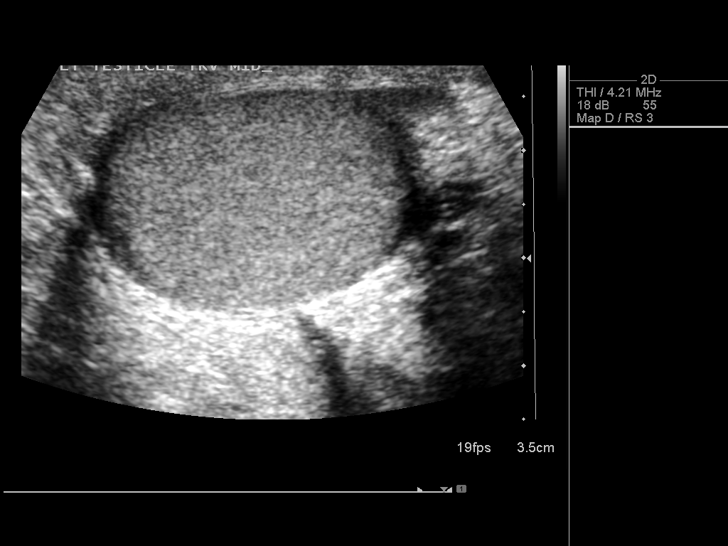
[im 29/44]
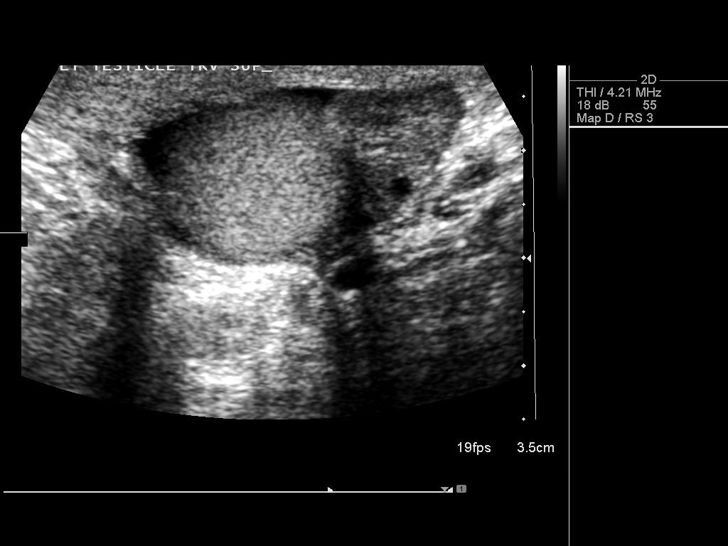
[im 33/44]
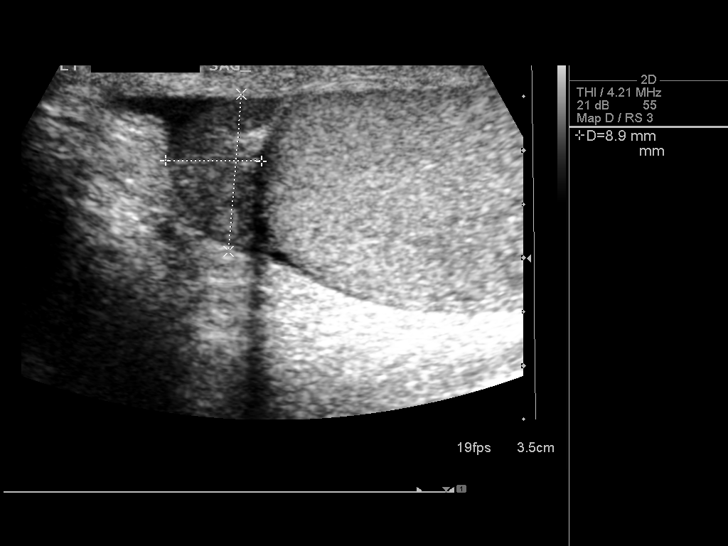
[im 36/44]
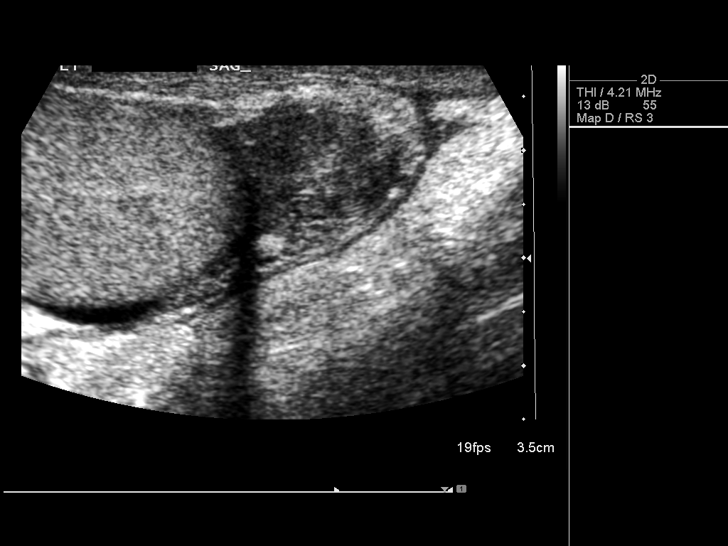
[im 40/44]
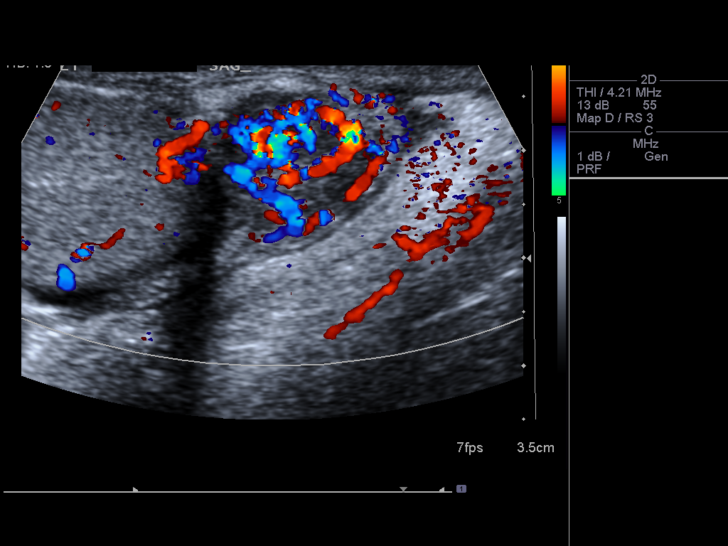
[im 44/44]
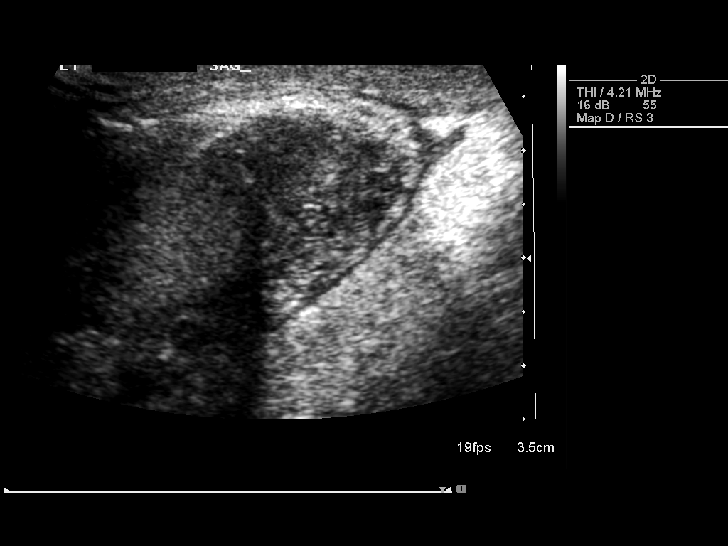

[14 of 25 positions shown; findings below may reference images not displayed]

FINDINGS: Right testicle

Measurements: 4.3 x 2.4 x 2.6 cm No mass or microlithiasis
visualized.

Left testicle

Measurements: 4.1 x 2.2 x 2.8 cm No mass or microlithiasis
visualized.

Right epididymis:  Normal in size and appearance.

Left epididymis:  Heterogeneous and hypervascular.

Hydrocele:  None visualized.

Varicocele:  None visualized.
IMPRESSION: Left epididymis is heterogeneous and hypervascular, suggesting
epididymitis.

## 2016-03-05 DIAGNOSIS — Z0000 Encounter for general adult medical examination without abnormal findings: Secondary | ICD-10-CM | POA: Diagnosis not present

## 2016-03-05 DIAGNOSIS — E7800 Pure hypercholesterolemia, unspecified: Secondary | ICD-10-CM | POA: Diagnosis not present

## 2017-03-20 DIAGNOSIS — E7800 Pure hypercholesterolemia, unspecified: Secondary | ICD-10-CM | POA: Diagnosis not present

## 2017-03-20 DIAGNOSIS — E669 Obesity, unspecified: Secondary | ICD-10-CM | POA: Diagnosis not present

## 2017-03-20 DIAGNOSIS — J309 Allergic rhinitis, unspecified: Secondary | ICD-10-CM | POA: Diagnosis not present

## 2017-09-24 DIAGNOSIS — E7800 Pure hypercholesterolemia, unspecified: Secondary | ICD-10-CM | POA: Diagnosis not present

## 2018-03-24 DIAGNOSIS — R05 Cough: Secondary | ICD-10-CM | POA: Diagnosis not present

## 2018-03-24 DIAGNOSIS — E7800 Pure hypercholesterolemia, unspecified: Secondary | ICD-10-CM | POA: Diagnosis not present

## 2018-03-24 DIAGNOSIS — E669 Obesity, unspecified: Secondary | ICD-10-CM | POA: Diagnosis not present

## 2018-03-24 DIAGNOSIS — J309 Allergic rhinitis, unspecified: Secondary | ICD-10-CM | POA: Diagnosis not present

## 2018-08-14 ENCOUNTER — Encounter: Payer: Self-pay | Admitting: Podiatry

## 2018-08-14 ENCOUNTER — Ambulatory Visit (INDEPENDENT_AMBULATORY_CARE_PROVIDER_SITE_OTHER): Payer: BC Managed Care – PPO

## 2018-08-14 ENCOUNTER — Other Ambulatory Visit: Payer: Self-pay

## 2018-08-14 ENCOUNTER — Ambulatory Visit: Payer: BC Managed Care – PPO | Admitting: Podiatry

## 2018-08-14 VITALS — Temp 98.0°F

## 2018-08-14 DIAGNOSIS — M79671 Pain in right foot: Secondary | ICD-10-CM | POA: Diagnosis not present

## 2018-08-14 DIAGNOSIS — M722 Plantar fascial fibromatosis: Secondary | ICD-10-CM | POA: Diagnosis not present

## 2018-08-14 DIAGNOSIS — M79672 Pain in left foot: Secondary | ICD-10-CM

## 2018-08-14 MED ORDER — MELOXICAM 15 MG PO TABS: 15.0000 mg | ORAL_TABLET | Freq: Every day | ORAL | 0 refills | Status: AC

## 2018-08-14 MED ORDER — TRIAMCINOLONE ACETONIDE 10 MG/ML IJ SUSP
10.0000 mg | Freq: Once | INTRAMUSCULAR | Status: AC
  Administered 2018-08-14: 10 mg

## 2018-08-14 NOTE — Patient Instructions (Signed)

## 2018-08-21 NOTE — Progress Notes (Signed)
Subjective:   Patient ID: Brent Whitaker, male   DOB: 48 y.o.   MRN: [id number]   HPI 48 year old male presents the office today for concerns of pain to the bottoms of both of his heels.  He has been ongoing since February 2020.  He states he purchased a night splint did help with fevers.  He gets pain more in the morning when he first gets up.  He denies any swelling.  Discussed with him in sensation about his heels.  No numbness or tingling.  The pain does not wake him up at night.  No other concerns.   Review of Systems  All other systems reviewed and are negative.  History reviewed. No pertinent past medical history.  History reviewed. No pertinent surgical history.   Current Outpatient Medications:  .  atorvastatin (LIPITOR) 40 MG tablet, TK 1 T PO TWICE A WEEK AS DIRECTED BY MD, Disp: , Rfl:  .  meloxicam (MOBIC) 15 MG tablet, Take 1 tablet (15 mg total) by mouth daily., Disp: 30 tablet, Rfl: 0  No Known Allergies       Objective:  Physical Exam  General: AAO x3, NAD  Dermatological: Skin is warm, dry and supple bilateral. Nails x 10 are well manicured; remaining integument appears unremarkable at this time. There are no open sores, no preulcerative lesions, no rash or signs of infection present.  Vascular: Dorsalis Pedis artery and Posterior Tibial artery pedal pulses are 2/4 bilateral with immedate capillary fill time. Pedal hair growth present. No varicosities and no lower extremity edema present bilateral. There is no pain with calf compression, swelling, warmth, erythema.   Neruologic: Grossly intact via light touch bilateral. Vibratory intact via tuning fork bilateral. Protective threshold with Semmes Wienstein monofilament intact to all pedal sites bilateral.  Negative Tinel sign.  Musculoskeletal: Tenderness to palpation along the plantar medial tubercle of the calcaneus at the insertion of plantar fascia on the left and right foot. There is no pain along the course of  the plantar fascia within the arch of the foot. Plantar fascia appears to be intact. There is no pain with lateral compression of the calcaneus or pain with vibratory sensation. There is no pain along the course or insertion of the achilles tendon. No other areas of tenderness to bilateral lower extremities. Muscular strength 5/5 in all groups tested bilateral.  Gait: Unassisted, Nonantalgic.       Assessment:   Bilateral heel pain, plantar fasciitis    Plan:  -Treatment options discussed including all alternatives, risks, and complications -Etiology of symptoms were discussed -X-rays were obtained and reviewed with the patient.  No evidence of acute fracture or stress fracture. -Plantar fascial braces dispensed x2 -Steroid injection performed.  See procedure note below. -Prescribed mobic. Discussed side effects of the medication and directed to stop if any are to occur and call the office.  -Discussed stretching, icing daily. -Shoe modifications and orthotics  Procedure: Injection Tendon/Ligament Discussed alternatives, risks, complications and verbal consent was obtained.  Location: Bilateral plantar fascia at the glabrous junction; medial approach. Skin Prep: Alcohol Injectate: 0.5cc 0.5% marcaine plain, 0.5 cc 2% lidocaine plain and, 1 cc kenalog 10. Disposition: Patient tolerated procedure well. Injection site dressed with a band-aid.  Post-injection care was discussed and return precautions discussed.    Return in about 3 weeks (around 09/04/2018).  Trula Slade DPM

## 2018-09-11 ENCOUNTER — Encounter: Payer: Self-pay | Admitting: Podiatry

## 2018-09-11 ENCOUNTER — Ambulatory Visit: Payer: BC Managed Care – PPO | Admitting: Podiatry

## 2018-09-11 ENCOUNTER — Other Ambulatory Visit: Payer: Self-pay

## 2018-09-11 VITALS — Temp 98.2°F

## 2018-09-11 DIAGNOSIS — R200 Anesthesia of skin: Secondary | ICD-10-CM

## 2018-09-11 DIAGNOSIS — M722 Plantar fascial fibromatosis: Secondary | ICD-10-CM | POA: Diagnosis not present

## 2018-09-22 ENCOUNTER — Other Ambulatory Visit: Payer: Self-pay | Admitting: *Deleted

## 2018-09-22 MED ORDER — MELOXICAM 15 MG PO TABS: 15.0000 mg | ORAL_TABLET | Freq: Every day | ORAL | 0 refills | Status: DC

## 2018-09-22 NOTE — Progress Notes (Signed)
Subjective: 48 year old male presents the office today for evaluation of bilateral heel pain, plantar fasciitis.  He said overall he is doing significantly better and his heels are doing much better.  He has been getting some tingling to his toes on the big toes.  This started the same time as the heel pain.  He associates this with the heel pain.  Is not progressed he denies.  Injections helped quite a bit.  Plantar fascial brace is also helpful. Denies any systemic complaints such as fevers, chills, nausea, vomiting. No acute changes since last appointment, and no other complaints at this time.   Objective: AAO x3, NAD DP/PT pulses palpable bilaterally, CRT less than 3 seconds At this time there is no area pinpoint tenderness there is no pain on the plantar aspect the calcaneus insertion.  There is no pain with lateral compression calcaneus.  No edema, erythema.  Negative Tinel sign.  Subjectively describing tingling to his big toes but overall sensation appears to be intact with Thornell Mule monofilament no open lesions or pre-ulcerative lesions.  No pain with calf compression, swelling, warmth, erythema  Assessment: Resolving plantar fasciitis, neuritis bilateral hallux  Plan: -All treatment options discussed with the patient including all alternatives, risks, complications.  -Over the heels are doing better.  Continue stretching, icing daily.  Discussed shoe modifications and orthotics.  Hopefully tingling will continue to resolve as the heels continue to improve.  However if not improvement more like ordered nerve conduction test. -Patient encouraged to call the office with any questions, concerns, change in symptoms.   Trula Slade DPM

## 2018-09-22 NOTE — Telephone Encounter (Signed)
Per Dr Jacqualyn Posey ok to refill meloxicam 15 mg and 30 tablets with no refills and called the patient to let him know as well. Lattie Haw

## 2019-03-25 DIAGNOSIS — E7800 Pure hypercholesterolemia, unspecified: Secondary | ICD-10-CM | POA: Diagnosis not present

## 2019-03-25 DIAGNOSIS — J309 Allergic rhinitis, unspecified: Secondary | ICD-10-CM | POA: Diagnosis not present

## 2019-03-25 DIAGNOSIS — R030 Elevated blood-pressure reading, without diagnosis of hypertension: Secondary | ICD-10-CM | POA: Diagnosis not present

## 2019-03-25 DIAGNOSIS — E669 Obesity, unspecified: Secondary | ICD-10-CM | POA: Diagnosis not present

## 2019-10-23 DIAGNOSIS — U071 COVID-19: Secondary | ICD-10-CM | POA: Diagnosis not present

## 2019-10-25 ENCOUNTER — Other Ambulatory Visit: Payer: Self-pay | Admitting: Family Medicine

## 2019-10-25 MED ORDER — MONTELUKAST SODIUM 10 MG PO TABS: 10.0000 mg | ORAL_TABLET | Freq: Every day | ORAL | 0 refills | Status: AC

## 2019-10-25 MED ORDER — LEVOCETIRIZINE DIHYDROCHLORIDE 5 MG PO TABS: 5.0000 mg | ORAL_TABLET | Freq: Two times a day (BID) | ORAL | 0 refills | Status: AC

## 2019-10-25 NOTE — Progress Notes (Signed)
Tested positive for covid day 8. Will follow up with me tomorrow for appointment, but remote health ordered. Will hopefully be set up in 24 hours or less. Can CXR as well . If oxygen stays low will need to go to ER. Also advised infusion since within the 10 day window.  Going to start him on singulair, levocetrizine 5mg  bid as well.  See him for telehealth tomorrow.   Dr. 

## 2019-10-26 ENCOUNTER — Other Ambulatory Visit: Payer: Self-pay

## 2019-10-26 ENCOUNTER — Telehealth (INDEPENDENT_AMBULATORY_CARE_PROVIDER_SITE_OTHER): Payer: BC Managed Care – PPO | Admitting: Family Medicine

## 2019-10-26 ENCOUNTER — Encounter: Payer: Self-pay | Admitting: Family Medicine

## 2019-10-26 VITALS — HR 71 | Temp 99.1°F | Ht 68.0 in | Wt 250.0 lb

## 2019-10-26 DIAGNOSIS — U071 COVID-19: Secondary | ICD-10-CM | POA: Diagnosis not present

## 2019-10-26 DIAGNOSIS — R0602 Shortness of breath: Secondary | ICD-10-CM | POA: Diagnosis not present

## 2019-10-26 MED ORDER — PREDNISONE 20 MG PO TABS: ORAL_TABLET | ORAL | 0 refills | Status: AC

## 2019-10-26 MED ORDER — IVERMECTIN 3 MG PO TABS: 400.0000 ug/kg | ORAL_TABLET | Freq: Every day | ORAL | 0 refills | Status: AC

## 2019-10-26 NOTE — Progress Notes (Signed)
Patient: Brent Whitaker MRN: [id number] DOB: [birth year] PCP: Clovis Riley, L.August Saucer, MD     I connected with Feliz Beam Dieu on 10/26/19 at 8:23 am by a video enabled telemedicine application and verified that I am speaking with the correct person using two identifiers.  Location patient: Home Location provider: [facility], Office Persons participating in this virtual visit: Kentrail Yazzie and Dr. Artis Flock  I discussed the limitations of evaluation and management by telemedicine and the availability of in person appointments. The patient expressed understanding and agreed to proceed.   Interactive audio and video telecommunications were attempted between this provider and patient, however failed, due to patient having technical difficulties OR patient did not have access to video capability.  We continued and completed visit with audio only.    Subjective:  Chief Complaint  Patient presents with  . Covid Exposure    HPI: The patient is a 49 y.o. male who presents today for Positive Covid. He tested positive 9 days ago. He said his first days of infection he thought it was a cold and took some vitamins (chaga mushroom, elderberry). He had fever as well to 102.6. He hasn't really had a fever in the last few days. He started to have a cough and shortness of breath around day 6 or 7. He states he would cough and realized he couldn't really catch his breath.  Currently he is mainly tired and just short of breath. Sitting his oxygenation is around 91% and he is very slow moving around. Moving around it drops down to 89%.   He is on day 4 of his azithromycin and his steroids have been sub optimal. He has had 40mg  x2 days and then 30mg . He also has some pulmicort. I also put him on the nutraceutical bundle of vitamins. He is doing this. Has not started his aspirin. He says that the current medications are turning a corner for him.   Review of Systems  Constitutional: Positive for chills and fatigue.  Negative for fever.  HENT: Positive for congestion. Negative for sore throat.   Respiratory: Positive for cough and shortness of breath. Negative for chest tightness and wheezing.   Cardiovascular: Negative for chest pain, palpitations and leg swelling.  Gastrointestinal: Negative for abdominal pain, diarrhea, nausea and vomiting.  Neurological: Negative for dizziness and headaches.    Allergies Patient has No Known Allergies.  Past Medical History Patient  has no past medical history on file.  Surgical History Patient  has no past surgical history on file.  Family History Pateint's family history is not on file.  Social History Patient  has an unknown smoking status. He has never used smokeless tobacco.    Objective: Vitals:   10/26/19 0805  Pulse: 71  Temp: 99.1 F (37.3 C)  TempSrc: Temporal  SpO2: 91%  Weight: 250 lb (113.4 kg)  Height: 5\' 8"  (1.727 m)    Body mass index is 38.01 kg/m.  Physical Exam Vitals reviewed.  Pulmonary:     Comments: Talking in normal sentences with no air hunger. Sounds comfortable.        Assessment/plan: 1. COVID-19 virus infection -day 9, inflammatory stage. Critical point.  -[facility] called last night to get home oxygen set up and CXR done. They have already been in touch with him and will see him today and will help follow him.  -continue nutraceutical bundle including zinc sulfate, vit D, vit C, quercetin and melatonin 5-15+ days depending on symptoms.  -he will start 50-50  H202/saline nebulizer Bid-TID. Discussed to do this outside.  -pulmicort inhaler BID -steroids not high enough. Discussed sending in new steroid px with 60mg  daily for 3 days then will taper since has been on longer than 7 days.  -singulair/xyzal to help with inflammation sent in.  -ivermectin X5 days. Increased dosage due to stage in diesae to .4mg /kg per dose. Discussed with family that this is still considered experimental; however, multiple RCT  have been done that have shown significant benefit with it's use. They would like to proceed.  -ASA 81mg  (no contraindication) x5-15 days -discussed hold all NSAIDs, would just do tylenol since on ASA.  -azithromycin 500mg  x 5 days on day 4.  -gargle mouthwash TID -outside as much as possible.   -strict precautions given with threshold for ER discussed.      Return if symptoms worsen or fail to improve.    , MD [facility]  10/26/2019

## 2019-10-27 DIAGNOSIS — U071 COVID-19: Secondary | ICD-10-CM | POA: Diagnosis not present

## 2019-10-28 DIAGNOSIS — Z1159 Encounter for screening for other viral diseases: Secondary | ICD-10-CM | POA: Diagnosis not present

## 2019-11-09 ENCOUNTER — Telehealth: Payer: Self-pay

## 2019-11-09 NOTE — Telephone Encounter (Signed)
This is a past pt of Dr. Blair Heys. His wife has called in wanting to know if Dr Artis Flock would accept her sister as a telehealth pt, but [place]. I told her I would ask

## 2019-11-09 NOTE — Telephone Encounter (Signed)
Please Advise

## 2019-11-10 NOTE — Telephone Encounter (Signed)
I lvm to call the office back.

## 2019-11-10 NOTE — Telephone Encounter (Signed)
Melitta,  Please let her know I wish I could, but I can only practice where I have a state license so that would not work. If they have covid, I do recommend [email or web address] for telehealth and treatment. They have treated over 25,000 covid patients with success. Please let me know if I can do anything else.  D.r Artis Flock

## 2019-11-15 NOTE — Telephone Encounter (Signed)
Left message to call the office.

## 2020-01-04 DIAGNOSIS — Z20822 Contact with and (suspected) exposure to covid-19: Secondary | ICD-10-CM | POA: Diagnosis not present

## 2020-03-24 DIAGNOSIS — E7800 Pure hypercholesterolemia, unspecified: Secondary | ICD-10-CM | POA: Diagnosis not present

## 2020-03-24 DIAGNOSIS — R030 Elevated blood-pressure reading, without diagnosis of hypertension: Secondary | ICD-10-CM | POA: Diagnosis not present

## 2020-03-24 DIAGNOSIS — J309 Allergic rhinitis, unspecified: Secondary | ICD-10-CM | POA: Diagnosis not present

## 2020-03-24 DIAGNOSIS — Z125 Encounter for screening for malignant neoplasm of prostate: Secondary | ICD-10-CM | POA: Diagnosis not present

## 2020-09-29 DIAGNOSIS — J029 Acute pharyngitis, unspecified: Secondary | ICD-10-CM | POA: Diagnosis not present

## 2020-09-29 DIAGNOSIS — R051 Acute cough: Secondary | ICD-10-CM | POA: Diagnosis not present

## 2020-10-02 DIAGNOSIS — Z1211 Encounter for screening for malignant neoplasm of colon: Secondary | ICD-10-CM | POA: Diagnosis not present

## 2021-03-01 DIAGNOSIS — E538 Deficiency of other specified B group vitamins: Secondary | ICD-10-CM | POA: Diagnosis not present

## 2021-03-01 DIAGNOSIS — Z1329 Encounter for screening for other suspected endocrine disorder: Secondary | ICD-10-CM | POA: Diagnosis not present

## 2021-03-01 DIAGNOSIS — Z131 Encounter for screening for diabetes mellitus: Secondary | ICD-10-CM | POA: Diagnosis not present

## 2021-03-01 DIAGNOSIS — E559 Vitamin D deficiency, unspecified: Secondary | ICD-10-CM | POA: Diagnosis not present

## 2021-03-01 DIAGNOSIS — L918 Other hypertrophic disorders of the skin: Secondary | ICD-10-CM | POA: Diagnosis not present

## 2021-03-01 DIAGNOSIS — R6882 Decreased libido: Secondary | ICD-10-CM | POA: Diagnosis not present

## 2021-03-01 DIAGNOSIS — R5383 Other fatigue: Secondary | ICD-10-CM | POA: Diagnosis not present

## 2021-03-01 DIAGNOSIS — T7840XA Allergy, unspecified, initial encounter: Secondary | ICD-10-CM | POA: Diagnosis not present

## 2021-03-29 DIAGNOSIS — E538 Deficiency of other specified B group vitamins: Secondary | ICD-10-CM | POA: Diagnosis not present

## 2021-03-29 DIAGNOSIS — R7989 Other specified abnormal findings of blood chemistry: Secondary | ICD-10-CM | POA: Diagnosis not present

## 2021-03-29 DIAGNOSIS — K219 Gastro-esophageal reflux disease without esophagitis: Secondary | ICD-10-CM | POA: Diagnosis not present

## 2021-03-29 DIAGNOSIS — E785 Hyperlipidemia, unspecified: Secondary | ICD-10-CM | POA: Diagnosis not present

## 2021-05-02 DIAGNOSIS — E669 Obesity, unspecified: Secondary | ICD-10-CM | POA: Diagnosis not present

## 2021-05-02 DIAGNOSIS — Z125 Encounter for screening for malignant neoplasm of prostate: Secondary | ICD-10-CM | POA: Diagnosis not present

## 2021-05-02 DIAGNOSIS — J309 Allergic rhinitis, unspecified: Secondary | ICD-10-CM | POA: Diagnosis not present

## 2021-05-02 DIAGNOSIS — E7800 Pure hypercholesterolemia, unspecified: Secondary | ICD-10-CM | POA: Diagnosis not present

## 2021-05-17 DIAGNOSIS — E785 Hyperlipidemia, unspecified: Secondary | ICD-10-CM | POA: Diagnosis not present

## 2021-05-17 DIAGNOSIS — R5383 Other fatigue: Secondary | ICD-10-CM | POA: Diagnosis not present

## 2021-05-17 DIAGNOSIS — E039 Hypothyroidism, unspecified: Secondary | ICD-10-CM | POA: Diagnosis not present

## 2021-05-17 DIAGNOSIS — E538 Deficiency of other specified B group vitamins: Secondary | ICD-10-CM | POA: Diagnosis not present

## 2021-06-12 DIAGNOSIS — E785 Hyperlipidemia, unspecified: Secondary | ICD-10-CM | POA: Diagnosis not present

## 2021-06-12 DIAGNOSIS — R5383 Other fatigue: Secondary | ICD-10-CM | POA: Diagnosis not present

## 2021-06-12 DIAGNOSIS — E039 Hypothyroidism, unspecified: Secondary | ICD-10-CM | POA: Diagnosis not present

## 2021-06-12 DIAGNOSIS — E786 Lipoprotein deficiency: Secondary | ICD-10-CM | POA: Diagnosis not present

## 2021-06-12 DIAGNOSIS — E559 Vitamin D deficiency, unspecified: Secondary | ICD-10-CM | POA: Diagnosis not present

## 2021-06-12 DIAGNOSIS — E538 Deficiency of other specified B group vitamins: Secondary | ICD-10-CM | POA: Diagnosis not present

## 2021-06-27 DIAGNOSIS — E039 Hypothyroidism, unspecified: Secondary | ICD-10-CM | POA: Diagnosis not present

## 2021-06-27 DIAGNOSIS — E785 Hyperlipidemia, unspecified: Secondary | ICD-10-CM | POA: Diagnosis not present

## 2021-06-27 DIAGNOSIS — E538 Deficiency of other specified B group vitamins: Secondary | ICD-10-CM | POA: Diagnosis not present

## 2021-06-27 DIAGNOSIS — R5383 Other fatigue: Secondary | ICD-10-CM | POA: Diagnosis not present

## 2021-08-10 DIAGNOSIS — E039 Hypothyroidism, unspecified: Secondary | ICD-10-CM | POA: Diagnosis not present

## 2021-08-10 DIAGNOSIS — E538 Deficiency of other specified B group vitamins: Secondary | ICD-10-CM | POA: Diagnosis not present

## 2021-08-10 DIAGNOSIS — E559 Vitamin D deficiency, unspecified: Secondary | ICD-10-CM | POA: Diagnosis not present

## 2021-08-10 DIAGNOSIS — R5383 Other fatigue: Secondary | ICD-10-CM | POA: Diagnosis not present

## 2021-10-26 DIAGNOSIS — I10 Essential (primary) hypertension: Secondary | ICD-10-CM | POA: Diagnosis not present

## 2021-10-26 DIAGNOSIS — R519 Headache, unspecified: Secondary | ICD-10-CM | POA: Diagnosis not present

## 2021-11-06 DIAGNOSIS — E538 Deficiency of other specified B group vitamins: Secondary | ICD-10-CM | POA: Diagnosis not present

## 2021-11-06 DIAGNOSIS — E039 Hypothyroidism, unspecified: Secondary | ICD-10-CM | POA: Diagnosis not present

## 2021-11-06 DIAGNOSIS — R7989 Other specified abnormal findings of blood chemistry: Secondary | ICD-10-CM | POA: Diagnosis not present

## 2021-11-06 DIAGNOSIS — R5383 Other fatigue: Secondary | ICD-10-CM | POA: Diagnosis not present

## 2021-11-06 DIAGNOSIS — E785 Hyperlipidemia, unspecified: Secondary | ICD-10-CM | POA: Diagnosis not present

## 2021-11-13 DIAGNOSIS — R7303 Prediabetes: Secondary | ICD-10-CM | POA: Diagnosis not present

## 2022-01-08 DIAGNOSIS — R5383 Other fatigue: Secondary | ICD-10-CM | POA: Diagnosis not present

## 2022-01-08 DIAGNOSIS — E7800 Pure hypercholesterolemia, unspecified: Secondary | ICD-10-CM | POA: Diagnosis not present

## 2022-01-08 DIAGNOSIS — E559 Vitamin D deficiency, unspecified: Secondary | ICD-10-CM | POA: Diagnosis not present

## 2022-01-08 DIAGNOSIS — E039 Hypothyroidism, unspecified: Secondary | ICD-10-CM | POA: Diagnosis not present

## 2022-04-30 DIAGNOSIS — U071 COVID-19: Secondary | ICD-10-CM | POA: Diagnosis not present

## 2022-04-30 DIAGNOSIS — R52 Pain, unspecified: Secondary | ICD-10-CM | POA: Diagnosis not present

## 2022-04-30 DIAGNOSIS — J101 Influenza due to other identified influenza virus with other respiratory manifestations: Secondary | ICD-10-CM | POA: Diagnosis not present

## 2022-04-30 DIAGNOSIS — J029 Acute pharyngitis, unspecified: Secondary | ICD-10-CM | POA: Diagnosis not present

## 2022-04-30 DIAGNOSIS — R051 Acute cough: Secondary | ICD-10-CM | POA: Diagnosis not present

## 2022-05-09 DIAGNOSIS — Z0000 Encounter for general adult medical examination without abnormal findings: Secondary | ICD-10-CM | POA: Diagnosis not present

## 2022-05-09 DIAGNOSIS — E7800 Pure hypercholesterolemia, unspecified: Secondary | ICD-10-CM | POA: Diagnosis not present

## 2022-05-09 DIAGNOSIS — Z125 Encounter for screening for malignant neoplasm of prostate: Secondary | ICD-10-CM | POA: Diagnosis not present

## 2022-05-09 DIAGNOSIS — R7303 Prediabetes: Secondary | ICD-10-CM | POA: Diagnosis not present

## 2022-07-16 DIAGNOSIS — N200 Calculus of kidney: Secondary | ICD-10-CM | POA: Diagnosis not present

## 2022-07-16 DIAGNOSIS — R3915 Urgency of urination: Secondary | ICD-10-CM | POA: Diagnosis not present

## 2023-03-25 DIAGNOSIS — J209 Acute bronchitis, unspecified: Secondary | ICD-10-CM | POA: Diagnosis not present

## 2023-04-29 DIAGNOSIS — J988 Other specified respiratory disorders: Secondary | ICD-10-CM | POA: Diagnosis not present

## 2023-05-05 DIAGNOSIS — Z130 Encounter for screening for diseases of the blood and blood-forming organs and certain disorders involving the immune mechanism: Secondary | ICD-10-CM | POA: Diagnosis not present

## 2023-05-05 DIAGNOSIS — E7800 Pure hypercholesterolemia, unspecified: Secondary | ICD-10-CM | POA: Diagnosis not present

## 2023-05-05 DIAGNOSIS — R7303 Prediabetes: Secondary | ICD-10-CM | POA: Diagnosis not present

## 2023-05-05 DIAGNOSIS — Z125 Encounter for screening for malignant neoplasm of prostate: Secondary | ICD-10-CM | POA: Diagnosis not present

## 2023-05-05 DIAGNOSIS — Z1159 Encounter for screening for other viral diseases: Secondary | ICD-10-CM | POA: Diagnosis not present

## 2023-05-12 DIAGNOSIS — E7800 Pure hypercholesterolemia, unspecified: Secondary | ICD-10-CM | POA: Diagnosis not present

## 2023-05-12 DIAGNOSIS — R7303 Prediabetes: Secondary | ICD-10-CM | POA: Diagnosis not present

## 2023-05-12 DIAGNOSIS — E669 Obesity, unspecified: Secondary | ICD-10-CM | POA: Diagnosis not present

## 2023-05-12 DIAGNOSIS — Z0000 Encounter for general adult medical examination without abnormal findings: Secondary | ICD-10-CM | POA: Diagnosis not present
